# Patient Record
Sex: Female | Born: 1995
Health system: Southern US, Community
[De-identification: ages and names within clinical notes are randomized; demographics above are authoritative.]

## PROBLEM LIST (undated history)

## (undated) DIAGNOSIS — J45909 Unspecified asthma, uncomplicated: Secondary | ICD-10-CM

## (undated) DIAGNOSIS — F419 Anxiety disorder, unspecified: Secondary | ICD-10-CM

## (undated) DIAGNOSIS — T7840XA Allergy, unspecified, initial encounter: Secondary | ICD-10-CM

## (undated) HISTORY — PX: NO PAST SURGERIES: SHX2092

## (undated) HISTORY — DX: Allergy, unspecified, initial encounter: T78.40XA

---
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
## 2017-01-09 ENCOUNTER — Encounter (HOSPITAL_COMMUNITY): Payer: Self-pay | Admitting: Emergency Medicine

## 2017-01-09 ENCOUNTER — Ambulatory Visit (HOSPITAL_COMMUNITY)
Admission: EM | Admit: 2017-01-09 | Discharge: 2017-01-09 | Disposition: A | Discharge status: Home / Self Care | Payer: Self-pay

## 2017-01-09 DIAGNOSIS — M7652 Patellar tendinitis, left knee: Secondary | ICD-10-CM

## 2017-01-09 NOTE — Discharge Instructions (Signed)
Ice , advil and rest knee as discussed, no pe until mon.

## 2017-01-09 NOTE — ED Triage Notes (Signed)
[facility] a complaint of left knee pain x 3 days. The patient denied any known injury.

## 2017-01-09 NOTE — ED Provider Notes (Signed)
[facility]    CSN: [id number] Arrival date & time: 01/09/17  1739     History   Chief Complaint Chief Complaint  Patient presents with  . Knee Pain    HPI Mariah Haas is a [age] y.o. female.   The history is provided by the patient and a parent.  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 Asthma     Patient Active Problem List   Diagnosis Date Noted  . Migraine without aura and without status migrainosus, not intractable 01/28/2014    History reviewed. No pertinent surgical history.  OB History    No data available       Home Medications    Prior to Admission medications   Medication Sig Start Date End Date Taking? Authorizing Provider  SPRINTEC 28 0.25-35 MG-MCG tablet Take 1 tablet by mouth daily. 07/28/16  Yes Historical Provider, MD    Family History Family History  Problem Relation Age of Onset  . Seizures Mother   . Bipolar disorder Mother   . Anxiety disorder Mother   . Migraines Mother   . Lung cancer Maternal Grandmother   . Migraines Maternal Grandfather   . Bipolar disorder Maternal Grandfather   . Seizures Maternal Aunt     Febrile Szs as an infant  . Migraines Maternal Aunt   . Bipolar disorder Maternal Aunt   . Anxiety disorder Maternal Aunt     Social History Social History  Substance Use Topics  . Smoking status: Never Smoker  . Smokeless tobacco: Never Used  . Alcohol use No      Allergies   Other   Review of Systems Review of Systems  Constitutional: Negative.  Negative for fever.  Musculoskeletal: Positive for stiffness. Negative for back pain, gait problem and joint swelling.  Neurological: Negative.   All other systems reviewed and are negative.    Physical Exam Triage Vital Signs ED Triage Vitals [01/09/17 1812]  Enc Vitals Group     BP 127/70     Pulse Rate 69     Resp 16     Temp 98.8 F (37.1 C)     Temp Source Oral     SpO2 100 %     Weight      Height      Head Circumference      Peak Flow      Pain Score 5     Pain Loc      Pain Edu?      Excl. in GC?    No data found.   Updated Vital Signs BP 127/70 (BP Location: Right Arm)   Pulse 69   Temp 98.8 F (37.1 C) (Oral)   Resp 16   LMP 01/06/2017 (Exact Date)   SpO2 100%   Visual Acuity Right Eye Distance:   Left Eye Distance:  Bilateral Distance:    Right Eye Near:   Left Eye Near:    Bilateral Near:     Physical Exam  Constitutional: She is oriented to person, place, and time. She appears well-developed and well-nourished.  Musculoskeletal: She exhibits tenderness.       Left knee: She exhibits abnormal patellar mobility. She exhibits normal range of motion, no swelling, no effusion and no bony tenderness. Tenderness found. Patellar tendon tenderness noted. No medial joint line and no lateral joint line tenderness noted.  Neurological: She is alert and oriented to person, place, and time.  Skin: Skin is warm and dry.  Nursing note and vitals reviewed.    UC Treatments / Results  Labs (all labs ordered are listed, but only abnormal results are displayed) Labs Reviewed - No data to display  EKG  EKG Interpretation None       Radiology No results found.  Procedures Procedures (including critical care time)  Medications Ordered in UC Medications - No data to display   Initial Impression / Assessment and Plan / UC Course  I have reviewed the  triage vital signs and the nursing notes.  Pertinent labs & imaging results that were available during my care of the patient were reviewed by me and considered in my medical decision making (see chart for details).       Final Clinical Impressions(s) / UC Diagnoses   Final diagnoses:  Patellar tendonitis of left knee    New Prescriptions New Prescriptions   No medications on file     Mariah Hoff, MD 01/09/17 1911

## 2017-03-04 ENCOUNTER — Emergency Department (HOSPITAL_BASED_OUTPATIENT_CLINIC_OR_DEPARTMENT_OTHER)
Admission: EM | Admit: 2017-03-04 | Discharge: 2017-03-04 | Disposition: A | Discharge status: Home / Self Care | Payer: Self-pay | Attending: Emergency Medicine | Admitting: Emergency Medicine

## 2017-03-04 ENCOUNTER — Encounter (HOSPITAL_BASED_OUTPATIENT_CLINIC_OR_DEPARTMENT_OTHER): Payer: Self-pay

## 2017-03-04 DIAGNOSIS — J45909 Unspecified asthma, uncomplicated: Secondary | ICD-10-CM | POA: Insufficient documentation

## 2017-03-04 DIAGNOSIS — G44209 Tension-type headache, unspecified, not intractable: Secondary | ICD-10-CM | POA: Insufficient documentation

## 2017-03-04 MED ORDER — DEXAMETHASONE SODIUM PHOSPHATE 10 MG/ML IJ SOLN
10.0000 mg | Freq: Once | INTRAMUSCULAR | Status: AC
  Administered 2017-03-04: 10 mg via INTRAVENOUS
  Filled 2017-03-04: qty 1

## 2017-03-04 MED ORDER — DIPHENHYDRAMINE HCL 50 MG/ML IJ SOLN
25.0000 mg | Freq: Once | INTRAMUSCULAR | Status: AC
  Administered 2017-03-04: 25 mg via INTRAVENOUS
  Filled 2017-03-04: qty 1

## 2017-03-04 MED ORDER — KETOROLAC TROMETHAMINE 15 MG/ML IJ SOLN
15.0000 mg | Freq: Once | INTRAMUSCULAR | Status: AC
  Administered 2017-03-04: 15 mg via INTRAVENOUS
  Filled 2017-03-04: qty 1

## 2017-03-04 MED ORDER — SODIUM CHLORIDE 0.9 % IV BOLUS (SEPSIS)
1000.0000 mL | Freq: Once | INTRAVENOUS | Status: AC
  Administered 2017-03-04: 1000 mL via INTRAVENOUS

## 2017-03-04 MED ORDER — METOCLOPRAMIDE HCL 5 MG/ML IJ SOLN
10.0000 mg | Freq: Once | INTRAMUSCULAR | Status: AC
  Administered 2017-03-04: 10 mg via INTRAVENOUS
  Filled 2017-03-04: qty 2

## 2017-03-04 NOTE — ED Notes (Signed)
Pt sleeping; resp even/unlabored; family at bedside.

## 2017-03-04 NOTE — Discharge Instructions (Signed)
Take Ibuprofen as needed for headache Follow up with a doctor if symptoms are not getting better Return for worsening symptoms

## 2017-03-04 NOTE — ED Provider Notes (Signed)
[facility] Provider Note   CSN: [id number] Arrival date & time: 03/04/17  1647   By signing my name below, I, Talbert Nan, attest that this documentation has been prepared under the direction and in the presence of Terance Hart, PA-C. Electronically Signed: Talbert Nan, Scribe. 03/04/17. 6:53 PM.    History   Chief Complaint Chief Complaint  Patient presents with  . Headache    HPI Mariah Haas is a 21 y.o. female who presents to the Emergency Department complaining of gradual onset, moderate, diffuse, 7/10 severity headache that began 3 weeks ago. Pt has associated generalized weakness and photophobia. Pt has never had a headache like this before. Pt states that she has been taking ibuprofen with temporary relief. Pt states that she has never been diagnosed with migraines before but there is a family h/o migraines. The patient's uncle has h/o brain CA. Pt denies fever, head injury, increased stress, neck pain, numbness or tingling of face, nausea, vomiting, diarrhea, syncope, dizziness.   The history is provided by the patient and a parent. No language interpreter was used.    Past Medical History:  Diagnosis Date  . Asthma     Patient Active Problem List   Diagnosis Date Noted  . Migraine without aura and without status migrainosus, not intractable 01/28/2014    History reviewed. No pertinent surgical history.  OB History    No data available       Home Medications    Prior to Admission medications   Medication Sig Start Date End Date Taking? Authorizing Provider  SPRINTEC 28 0.25-35 MG-MCG tablet Take 1 tablet by mouth daily. 07/28/16   Historical Provider, MD    Family History Family History  Problem Relation Age of Onset  . Seizures Mother   . Bipolar disorder Mother   . Anxiety disorder Mother   . Migraines Mother   . Lung cancer Maternal Grandmother   . Migraines Maternal Grandfather   . Bipolar disorder Maternal Grandfather   .  Seizures Maternal Aunt     Febrile Szs as an infant  . Migraines Maternal Aunt   . Bipolar disorder Maternal Aunt   . Anxiety disorder Maternal Aunt     Social History Social History  Substance Use Topics  . Smoking status: Never Smoker  . Smokeless tobacco: Never Used  . Alcohol use No     Allergies   Patient has no active allergies.   Review of Systems Review of Systems  Constitutional: Negative for fever.  HENT: Negative for sinus pressure.   Eyes: Positive for photophobia. Negative for visual disturbance.  Gastrointestinal: Negative for diarrhea, nausea and vomiting.  Neurological: Positive for weakness and headaches. Negative for dizziness, syncope, light-headedness and numbness.     Physical Exam Updated Vital Signs BP 117/70 (BP Location: Right Arm)   Pulse 76   Temp 98.2 F (36.8 C) (Oral)   Resp 16   Ht 5' (1.524 m)   Wt 95 lb (43.1 kg)   LMP 02/27/2017   SpO2 100%   BMI 18.55 kg/m   Physical Exam  Constitutional: She is oriented to person, place, and time. She appears well-developed and well-nourished. No distress.  HENT:  Head: Normocephalic and atraumatic.  Eyes: Conjunctivae are normal. Pupils are equal, round, and reactive to light. Right eye exhibits no discharge. Left eye exhibits no discharge. No scleral icterus.  Neck: Normal range of motion. Neck supple.  Cardiovascular: Normal rate and regular rhythm.   No murmur heard.  Pulmonary/Chest: Effort normal and breath sounds normal. No respiratory distress.  Abdominal: Soft. She exhibits no distension. There is no tenderness.  Musculoskeletal: She exhibits no edema.  Mild tenderness of right trapezius  Neurological: She is alert and oriented to person, place, and time.  Lying on stretcher in NAD. GCS 15. Speaks in a clear voice. Cranial nerves II through XII grossly intact. 5/5 strength in all extremities. Sensation fully intact.  Bilateral finger-to-nose intact. Normal gait   Skin: Skin is warm  and dry.  Psychiatric: She has a normal mood and affect. Her behavior is normal.  Nursing note and vitals reviewed.    ED Treatments / Results   DIAGNOSTIC STUDIES: Oxygen Saturation is 100% on room air, normal by my interpretation.    COORDINATION OF CARE: 6:49 PM Discussed treatment plan with pt at bedside and pt agreed to plan, which include IV migraine cocktail and fluids.    Labs (all labs ordered are listed, but only abnormal results are displayed) Labs Reviewed - No data to display  EKG  EKG Interpretation None       Radiology No results found.  Procedures Procedures (including critical care time)  Medications Ordered in ED Medications - No data to display   Initial Impression / Assessment and Plan / ED Course   I have reviewed the triage vital signs and the nursing notes.  Pertinent labs & imaging results that were available during my care of the patient were reviewed by me and considered in my medical decision making (see chart for details).  21 year old female with headache. Vitals are normal. Pt HA treated and improved while in ED.  Although she denies headache like this before, she is low risk for [facility], ICH, Meningitis, or temporal arteritis. Pt is afebrile with no focal neuro deficits, nuchal rigidity, or change in vision. Pt is to follow up with PCP if headache returns and persists. Return precautions to ED given. Pt verbalizes understanding and is agreeable with plan to dc.    Final Clinical Impressions(s) / ED Diagnoses   Final diagnoses:  Acute non intractable tension-type headache    New Prescriptions New Prescriptions   No medications on file   I personally performed the services described in this documentation, which was scribed in my presence. The recorded information has been reviewed and is accurate.     Bethel Born, PA-C 03/05/17 1543    Geoffery Lyons, MD 03/05/17 2132

## 2017-03-04 NOTE — ED Triage Notes (Signed)
c/o HA x 3 weeks-NAD-steady gait

## 2017-03-15 ENCOUNTER — Emergency Department (HOSPITAL_COMMUNITY)
Admission: EM | Admit: 2017-03-15 | Discharge: 2017-03-15 | Disposition: A | Discharge status: Home / Self Care | Payer: Self-pay | Attending: Emergency Medicine | Admitting: Emergency Medicine

## 2017-03-15 ENCOUNTER — Encounter (HOSPITAL_COMMUNITY): Payer: Self-pay | Admitting: Emergency Medicine

## 2017-03-15 DIAGNOSIS — G44209 Tension-type headache, unspecified, not intractable: Secondary | ICD-10-CM | POA: Insufficient documentation

## 2017-03-15 DIAGNOSIS — Z79899 Other long term (current) drug therapy: Secondary | ICD-10-CM | POA: Insufficient documentation

## 2017-03-15 DIAGNOSIS — R55 Syncope and collapse: Secondary | ICD-10-CM | POA: Insufficient documentation

## 2017-03-15 DIAGNOSIS — J45909 Unspecified asthma, uncomplicated: Secondary | ICD-10-CM | POA: Insufficient documentation

## 2017-03-15 LAB — URINALYSIS, ROUTINE W REFLEX MICROSCOPIC
BACTERIA UA: NONE SEEN
BILIRUBIN URINE: NEGATIVE
Glucose, UA: NEGATIVE mg/dL
Ketones, ur: NEGATIVE mg/dL
LEUKOCYTES UA: NEGATIVE
Nitrite: NEGATIVE
Protein, ur: NEGATIVE mg/dL
SPECIFIC GRAVITY, URINE: 1.021 (ref 1.005–1.030)
pH: 5 (ref 5.0–8.0)

## 2017-03-15 LAB — CBC
HEMATOCRIT: 40 % (ref 36.0–46.0)
Hemoglobin: 13.2 g/dL (ref 12.0–15.0)
MCH: 28.9 pg (ref 26.0–34.0)
MCHC: 33 g/dL (ref 30.0–36.0)
MCV: 87.5 fL (ref 78.0–100.0)
Platelets: 318 10*3/uL (ref 150–400)
RBC: 4.57 MIL/uL (ref 3.87–5.11)
RDW: 12.6 % (ref 11.5–15.5)
WBC: 8.3 10*3/uL (ref 4.0–10.5)

## 2017-03-15 LAB — BASIC METABOLIC PANEL
Anion gap: 7 (ref 5–15)
BUN: 11 mg/dL (ref 6–20)
CHLORIDE: 104 mmol/L (ref 101–111)
CO2: 25 mmol/L (ref 22–32)
Calcium: 9.3 mg/dL (ref 8.9–10.3)
Creatinine, Ser: 0.73 mg/dL (ref 0.44–1.00)
GFR calc Af Amer: >60 mL/min (ref >60)
GFR calc non Af Amer: >60 mL/min (ref >60)
GLUCOSE: 98 mg/dL (ref 65–99)
POTASSIUM: 4.1 mmol/L (ref 3.5–5.1)
SODIUM: 136 mmol/L (ref 135–145)

## 2017-03-15 LAB — I-STAT BETA HCG BLOOD, ED (MC, WL, AP ONLY)

## 2017-03-15 LAB — CBG MONITORING, ED: GLUCOSE-CAPILLARY: 106 mg/dL — AB (ref 65–99)

## 2017-03-15 MED ORDER — KETOROLAC TROMETHAMINE 30 MG/ML IJ SOLN
30.0000 mg | Freq: Once | INTRAMUSCULAR | Status: AC
  Administered 2017-03-15: 30 mg via INTRAVENOUS
  Filled 2017-03-15: qty 1

## 2017-03-15 MED ORDER — SODIUM CHLORIDE 0.9 % IV BOLUS (SEPSIS)
1000.0000 mL | Freq: Once | INTRAVENOUS | Status: AC
  Administered 2017-03-15: 1000 mL via INTRAVENOUS

## 2017-03-15 NOTE — ED Triage Notes (Signed)
Pt verbalizes awoke around 0800 with shakiness and "felling like passing out." Pt verbalizes has eaten today; pt denies n/v/d.

## 2017-03-15 NOTE — ED Notes (Signed)
ED Provider at bedside. 

## 2017-03-15 NOTE — ED Notes (Signed)
Pt ambulatory and independent at discharge.  Verbalized understanding of discharge instructions 

## 2017-03-15 NOTE — ED Provider Notes (Addendum)
[facility]-EMERGENCY DEPT Provider Note   CSN: [id number] Arrival date & time: 03/15/17  1608     History   Chief Complaint Chief Complaint  Patient presents with  . Near Syncope    HPI Mariah Haas is a 21 y.o. female.  Patient is a 21 year old female with a history of asthma and migraines who presents with a near syncopal episode. She states this morning she walked into the kitchen and got dizzy lightheaded and felt like she was on a pass out. She doesn't feel like that now but she just feels weak all over. She has a bifrontal-type headache which she says she's been having pre-much daily for about the last 2-3 months. She denies any numbness or weakness to her extreme age. No vision changes. No recent cough or cold symptoms. No chest pain or shortness of breath. No palpitations. No urinary symptoms. Her last menstrual cycle was on April 11 and was normal for her. She's been eating and drinking okay.      Past Medical History:  Diagnosis Date  . Asthma     Patient Active Problem List   Diagnosis Date Noted  . Migraine without aura and without status migrainosus, not intractable 01/28/2014    History reviewed. No pertinent surgical history.  OB History    No data available       Home Medications    Prior to Admission medications   Medication Sig Start Date End Date Taking? Authorizing Provider  SPRINTEC 28 0.25-35 MG-MCG tablet Take 1 tablet by mouth daily. 07/28/16  Yes Historical Provider, MD    Family History Family History  Problem Relation Age of Onset  . Seizures Mother   . Bipolar disorder Mother   . Anxiety disorder Mother   . Migraines Mother   . Lung cancer Maternal Grandmother   . Migraines Maternal Grandfather   . Bipolar disorder Maternal Grandfather   . Seizures Maternal Aunt     Febrile Szs as an infant  . Migraines Maternal Aunt   . Bipolar disorder Maternal Aunt   . Anxiety disorder Maternal Aunt     Social History Social History    Substance Use Topics  . Smoking status: Never Smoker  . Smokeless tobacco: Never Used  . Alcohol use No     Allergies   Patient has no known allergies.   Review of Systems Review of Systems  Constitutional: Positive for fatigue. Negative for chills, diaphoresis and fever.  HENT: Negative for congestion, rhinorrhea and sneezing.   Eyes: Negative.   Respiratory: Negative for cough, chest tightness and shortness of breath.   Cardiovascular: Negative for chest pain and leg swelling.  Gastrointestinal: Negative for abdominal pain, blood in stool, diarrhea, nausea and vomiting.  Genitourinary: Negative for difficulty urinating, flank pain, frequency and hematuria.  Musculoskeletal: Negative for arthralgias and back pain.  Skin: Negative for rash.  Neurological: Positive for weakness (Generalized), light-headedness and headaches. Negative for dizziness, speech difficulty and numbness.     Physical Exam Updated Vital Signs BP 106/64 (BP Location: Right Arm)   Pulse 78   Temp 98.8 F (37.1 C) (Oral)   Resp (!) 21   LMP 02/27/2017   SpO2 100%   Physical Exam  Constitutional: She is oriented to person, place, and time. She appears well-developed and well-nourished.  HENT:  Head: Normocephalic and atraumatic.  Eyes: Pupils are equal, round, and reactive to light.  Neck: Normal range of motion. Neck supple.  Cardiovascular: Normal rate, regular rhythm and normal  heart sounds.   Pulmonary/Chest: Effort normal and breath sounds normal. No respiratory distress. She has no wheezes. She has no rales. She exhibits no tenderness.  Abdominal: Soft. Bowel sounds are normal. There is no tenderness. There is no rebound and no guarding.  Musculoskeletal: Normal range of motion. She exhibits no edema.  Lymphadenopathy:    She has no cervical adenopathy.  Neurological: She is alert and oriented to person, place, and time.  Motor 5/5 all extremities Sensation grossly intact to LT all  extremities Finger to Nose intact, no pronator drift CN II-XII grossly intact Gait normal   Skin: Skin is warm and dry. No rash noted.  Psychiatric: She has a normal mood and affect.     ED Treatments / Results  Labs (all labs ordered are listed, but only abnormal results are displayed) Labs Reviewed  URINALYSIS, ROUTINE W REFLEX MICROSCOPIC - Abnormal; Notable for the following:       Result Value   APPearance HAZY (*)    Hgb urine dipstick SMALL (*)    Squamous Epithelial / LPF 0-5 (*)    All other components within normal limits  CBG MONITORING, ED - Abnormal; Notable for the following:    Glucose-Capillary 106 (*)    All other components within normal limits  BASIC METABOLIC PANEL  CBC  I-STAT BETA HCG BLOOD, ED ([facility], [facility], [facility] ONLY)    EKG  EKG Interpretation  Date/Time:  Friday March 15 2017 16:16:03 EDT Ventricular Rate:  75 PR Interval:    QRS Duration: 84 QT Interval:  367 QTC Calculation: 410 R Axis:   79 Text Interpretation:  Sinus rhythm Low voltage, precordial leads Nonspecific T abnormalities, anterior leads Baseline wander in lead(s) I aVR V6 since last tracing no significant change Confirmed by Chessica Audia  MD, Teanna Elem ([id number]) on 03/15/2017 4:34:36 PM       Radiology No results found.  Procedures Procedures (including critical care time)  Medications Ordered in ED Medications  sodium chloride 0.9 % bolus 1,000 mL (0 mLs Intravenous Stopped 03/15/17 1837)  ketorolac (TORADOL) 30 MG/ML injection 30 mg (30 mg Intravenous Given 03/15/17 1700)     Initial Impression / Assessment and Plan / ED Course  I have reviewed the triage vital signs and the nursing notes.  Pertinent labs & imaging results that were available during my care of the patient were reviewed by me and considered in my medical decision making (see chart for details).     Patient presents after a near-syncopal episode happened this morning. Her labs are non-concerning. Her pregnancy test is  negative. Her urine does not appear to be infected. She's neurologically intact. She does complain of a headache. She's had this similar type headaches over the last 2-3 months. She has no focal neurologic deficits. She is ambulating without ataxia. Her headache has resolved after Toradol. She was also given IV fluids. She was discharged home good condition.  She has an appointment to follow-up with her primary care physician on Monday. Return precautions were given.  Final Clinical Impressions(s) / ED Diagnoses   Final diagnoses:  Near syncope  Tension-type headache, not intractable, unspecified chronicity pattern    New Prescriptions Discharge Medication List as of 03/15/2017  6:37 PM       Rolan Bucco, MD 03/15/17 1610    Rolan Bucco, MD 03/15/17 [id number]

## 2017-03-18 ENCOUNTER — Encounter: Payer: Self-pay | Admitting: Family Medicine

## 2017-03-18 ENCOUNTER — Ambulatory Visit (INDEPENDENT_AMBULATORY_CARE_PROVIDER_SITE_OTHER): Payer: Self-pay | Admitting: Family Medicine

## 2017-03-18 VITALS — BP 98/60 | HR 68 | Resp 12 | Ht 60.0 in | Wt 94.1 lb

## 2017-03-18 DIAGNOSIS — R51 Headache: Secondary | ICD-10-CM

## 2017-03-18 DIAGNOSIS — J309 Allergic rhinitis, unspecified: Secondary | ICD-10-CM | POA: Insufficient documentation

## 2017-03-18 DIAGNOSIS — R519 Headache, unspecified: Secondary | ICD-10-CM

## 2017-03-18 MED ORDER — AMITRIPTYLINE HCL 25 MG PO TABS: 12.0000 mg | ORAL_TABLET | Freq: Every day | ORAL | 1 refills | Status: DC

## 2017-03-18 MED ORDER — FLUTICASONE PROPIONATE 50 MCG/ACT NA SUSP: 1.0000 | Freq: Two times a day (BID) | NASAL | 3 refills | Status: DC

## 2017-03-18 NOTE — Patient Instructions (Signed)
A few things to remember from today's visit:   Worsening headaches - Plan: MR MRA Head/Brain Wo Cm, amitriptyline (ELAVIL) 25 MG tablet  Allergic rhinitis, unspecified seasonality, unspecified trigger - Plan: fluticasone (FLONASE) 50 MCG/ACT nasal spray  Eye exam.  Medication causes drowsiness, so take at bedtime.   Please be sure medication list is accurate. If a new problem present, please set up appointment sooner than planned today.

## 2017-03-18 NOTE — Progress Notes (Signed)
Pre visit review using our clinic review tool, if applicable. No additional management support is needed unless otherwise documented below in the visit note. 

## 2017-03-18 NOTE — Progress Notes (Signed)
HPI:   [name] is a 21 y.o. female, who is here today with her mother to establish care.  Former PCP: Dr Earlene Plater Last preventive routine visit: 1-2 years.  Chronic medical problems: Asthma, allergic rhinitis. Vaccination reported as up to date.    Concerns today: Headaches.  Her mother is very concerned about constant headaches for the past 2 months. She has ben in the ER 4 times since 11/2016: sore throat,knee pain, and last 2 visits in 02/2017 for headache and syncopal episode.  Started with new onset headache last week of March 2018, affects different areas and sometimes the whole head. She has daily headaches. She is not sure about exacerbating or alleviating factors. She wakes up with headache, sharp, 8-9/10. OTC Advil helps little. Headache getting worse.  Denies cervical pain. No associated photophobia,visual changes,nausea,vomiting,or focal weakness.  Denies fever,chills,abnormal wt loss, decreased appetite,or sleep disturbances.  2-3 months ago she felt backwards, landed on her back,denies head trauma.  03/15/17 she was in the ER because near syncope. Episode was not witnessed , denies seizure like activity,urine/bowel incontinence. She felt like she was going to [employer] lie on floor for a few minutes.  Lab Results  Component Value Date   WBC 8.3 03/15/2017   HGB 13.2 03/15/2017   HCT 40.0 03/15/2017   MCV 87.5 03/15/2017   PLT 318 03/15/2017   Lab Results  Component Value Date   CREATININE 0.73 03/15/2017   BUN 11 03/15/2017   NA 136 03/15/2017   K 4.1 03/15/2017   CL 104 03/15/2017   CO2 25 03/15/2017    LMP 3 weeks ago. She denies any stressful situation at home or school. She is in 12 grade, making A's and B's.  She lives with parents and brother. Denies Hx of anxiety or depression.  Mother is upset because brain MRI was not done. Mother mentions that maternal uncle was Dx with astrocytoma.  She denies prior Hx of headaches  but has migraine listed on her problem list. Mother has Hx of migraines.  Hx of allergies, having nasal congestion,rhinorrhea, and post nasal drainage. She is not taking OTC medications.  Last eye exam 2 years ago. She has taken OCP's for a few years.   Review of Systems  Constitutional: Positive for fatigue. Negative for chills, fever and unexpected weight change.  HENT: Positive for congestion, postnasal drip and rhinorrhea. Negative for mouth sores, nosebleeds, sinus pain, sore throat and trouble swallowing.   Eyes: Negative for photophobia, pain and visual disturbance.  Respiratory: Negative for shortness of breath and wheezing.   Cardiovascular: Negative for chest pain, palpitations and leg swelling.  Gastrointestinal: Negative for abdominal pain, nausea and vomiting.       No changes in bowel habits.  Endocrine: Negative for cold intolerance and heat intolerance.  Genitourinary: Negative for decreased urine volume and hematuria.  Musculoskeletal: Negative for gait problem and myalgias.  Skin: Negative for pallor and rash.  Allergic/Immunologic: Positive for environmental allergies.  Neurological: Positive for headaches. Negative for seizures, syncope, weakness and numbness.  Hematological: Negative for adenopathy. Does not bruise/bleed easily.  Psychiatric/Behavioral: Negative for confusion and sleep disturbance. The patient is not nervous/anxious.       Current Outpatient Prescriptions on File Prior to Visit  Medication Sig Dispense Refill  . SPRINTEC 28 0.25-35 MG-MCG tablet Take 1 tablet by mouth daily.  2   No current facility-administered medications on file prior to visit.      Past  Medical History:  Diagnosis Date  . Allergy   . Asthma    No Known Allergies  Family History  Problem Relation Age of Onset  . Seizures Mother   . Bipolar disorder Mother   . Anxiety disorder Mother   . Migraines Mother   . Headache Mother   . Diabetes Mother   . Lung  cancer Maternal Grandmother   . Migraines Maternal Grandfather   . Bipolar disorder Maternal Grandfather   . Seizures Maternal Aunt     Febrile Szs as an infant  . Migraines Maternal Aunt   . Bipolar disorder Maternal Aunt   . Anxiety disorder Maternal Aunt   . Cancer Maternal Aunt     lung  . Cancer Paternal Uncle     Astrocytoma    Social History   Social History  . Marital status: Single    Spouse name: N/A  . Number of children: N/A  . Years of education: N/A   Social History Main Topics  . Smoking status: Never Smoker  . Smokeless tobacco: Never Used  . Alcohol use No  . Drug use: No  . Sexual activity: Not Asked   Other Topics Concern  . None   Social History Narrative  . None    Vitals:   03/18/17 0850  BP: 98/60  Pulse: 68  Resp: 12    Body mass index is 18.38 kg/m.  Physical Exam  Nursing note and vitals reviewed. Constitutional: She is oriented to person, place, and time. She appears well-developed and well-nourished. No distress.  HENT:  Head: Atraumatic.  Nose: Right sinus exhibits no maxillary sinus tenderness and no frontal sinus tenderness. Left sinus exhibits no maxillary sinus tenderness and no frontal sinus tenderness.  Mouth/Throat: Uvula is midline, oropharynx is clear and moist and mucous membranes are normal.  Hypertrophic turbinates,post nasal drainage.   Eyes: Conjunctivae and EOM are normal. Pupils are equal, round, and reactive to light.  Neck: No tracheal deviation present. No thyroid mass and no thyromegaly present.  Cardiovascular: Normal rate and regular rhythm.   No murmur heard. Pulses:      Dorsalis pedis pulses are 2+ on the right side, and 2+ on the left side.  Respiratory: Effort normal and breath sounds normal. No respiratory distress.  GI: Soft. She exhibits no mass. There is no hepatomegaly. There is no tenderness.  Musculoskeletal: She exhibits no edema.       Cervical back: She exhibits normal range of motion, no  tenderness and no bony tenderness.       Thoracic back: She exhibits no tenderness and no bony tenderness.  Tenderness upon palpation of parietal scalp.  Lymphadenopathy:    She has no cervical adenopathy.  Neurological: She is alert and oriented to person, place, and time. She has normal strength. No cranial nerve deficit. Coordination and gait normal.  Reflex Scores:      Bicep reflexes are 2+ on the right side and 2+ on the left side.      Patellar reflexes are 2+ on the right side and 2+ on the left side. Skin: Skin is warm. No erythema.  Psychiatric:  Well groomed, poor eye contact. Flat mood.    ASSESSMENT AND PLAN:   Dorrine was seen today for establish care.  Diagnoses and all orders for this visit:  Worsening headaches  We discussed possible etiologies, neurologic examination today normal. ? Tension headache. ? Migraine.  Brain MRI was ordered. Recommend scheduling eye exam.  Clearly instructed about  warning signs. She agrees with trying low dose Amitriptyline, we discussed some side effects. F/U in 3-4 weeks.  -     MR MRA Head/Brain Wo Cm; Future -     amitriptyline (ELAVIL) 25 MG tablet; Take 0.5 tablets (12.5 mg total) by mouth at bedtime.  Allergic rhinitis, unspecified seasonality, unspecified trigger  Could aggravate headaches. Nasal irrigations as needed. Flonase nasal spray and OTC antihistaminic recommended.  -     fluticasone (FLONASE) 50 MCG/ACT nasal spray; Place 1 spray into both nostrils 2 (two) times daily.        Mariah Stickle G. [place], MD  [facility]. [place] office.

## 2017-03-28 ENCOUNTER — Other Ambulatory Visit: Payer: Self-pay | Admitting: Family Medicine

## 2017-03-28 ENCOUNTER — Ambulatory Visit
Admission: RE | Admit: 2017-03-28 | Discharge: 2017-03-28 | Disposition: A | Discharge status: Home / Self Care | Payer: No Typology Code available for payment source | Source: Ambulatory Visit | Attending: Family Medicine | Admitting: Family Medicine

## 2017-03-28 DIAGNOSIS — R519 Headache, unspecified: Secondary | ICD-10-CM

## 2017-03-28 DIAGNOSIS — R51 Headache: Principal | ICD-10-CM

## 2017-04-08 NOTE — Progress Notes (Signed)
HPI:   Ms.Tesia MOE BRIER is a 21 y.o. female, who is here today with her mother to follow on recent OV.   She was seen on 03/18/17, at that time she and her mother were concerned about 2 months of constant headaches. She started Amitriptyline 25 mg , taking 1/2 tab at bedtime, she is tolerating medication well,deneis side effects. Headache is mainly occipital and parietal, sometimes frontal, dull, now 2/10 (8-9/10), one time it was 8/10. She is still having daily headaches but she does not wake up with it, usually starts around 11-12 noon. She has not missed school or been in the ER since her last OV. She denies associated visual changes, photophobia, nausea,or vomiting.   03/28/17 Brain MRI: Normal MRI of the brain Mild mucosal edema in the paranasal sinuses.  She is using Flonase nasal spray and nasal congestion, rhinorrhea have improved. She is using it as needed. She has not had eye exam.  She denies fever,chills, fatigue, or body aches.   Overall she is feeling better, no new symptoms reported.   No new concerns today.   Review of Systems  Constitutional: Negative for chills, fatigue, fever and unexpected weight change.  HENT: Positive for sinus pressure. Negative for dental problem, facial swelling, nosebleeds, tinnitus and trouble swallowing.   Eyes: Negative for photophobia, pain and visual disturbance.  Respiratory: Negative for cough, shortness of breath and wheezing.   Cardiovascular: Negative for palpitations and leg swelling.  Gastrointestinal: Negative for abdominal pain, nausea and vomiting.       No changes in bowel habits.  Musculoskeletal: Negative for back pain and neck pain.  Skin: Negative for rash.  Allergic/Immunologic: Positive for environmental allergies.  Neurological: Positive for headaches. Negative for seizures, syncope, weakness and numbness.  Psychiatric/Behavioral: Negative for confusion and sleep disturbance. The patient is not  nervous/anxious.       Current Outpatient Prescriptions on File Prior to Visit  Medication Sig Dispense Refill  . fluticasone (FLONASE) 50 MCG/ACT nasal spray Place 1 spray into both nostrils 2 (two) times daily. 16 g 3  . SPRINTEC 28 0.25-35 MG-MCG tablet Take 1 tablet by mouth daily.  2   No current facility-administered medications on file prior to visit.      Past Medical History:  Diagnosis Date  . Allergy   . Asthma    No Known Allergies  Social History   Social History  . Marital status: Single    Spouse name: N/A  . Number of children: N/A  . Years of education: N/A   Social History Main Topics  . Smoking status: Never Smoker  . Smokeless tobacco: Never Used  . Alcohol use No  . Drug use: No  . Sexual activity: Not Asked   Other Topics Concern  . None   Social History Narrative  . None    Vitals:   04/09/17 0929  BP: 94/70  Pulse: 90  Resp: 12   Body mass index is 18.94 kg/m.   Physical Exam  Nursing note and vitals reviewed. Constitutional: She is oriented to person, place, and time. She appears well-developed and well-nourished. No distress.  HENT:  Head: Atraumatic.  Nose: Right sinus exhibits frontal sinus tenderness. Right sinus exhibits no maxillary sinus tenderness. Left sinus exhibits frontal sinus tenderness. Left sinus exhibits no maxillary sinus tenderness.  Mouth/Throat: Oropharynx is clear and moist and mucous membranes are normal.  Eyes: Conjunctivae and EOM are normal. Pupils are equal, round, and reactive to  light.  Cardiovascular: Normal rate and regular rhythm.   No murmur heard. Pulses:      Dorsalis pedis pulses are 2+ on the right side, and 2+ on the left side.  Respiratory: Effort normal and breath sounds normal. No respiratory distress.  Musculoskeletal: She exhibits no edema.       Cervical back: She exhibits normal range of motion, no tenderness and no bony tenderness.  Lymphadenopathy:    She has no cervical  adenopathy.  Neurological: She is alert and oriented to person, place, and time. She has normal strength. No cranial nerve deficit. Gait normal.  Reflex Scores:      Patellar reflexes are 2+ on the right side and 2+ on the left side. Skin: Skin is warm. No rash noted. No erythema.  Psychiatric: She has a normal mood and affect.  Well groomed, good eye contact.     ASSESSMENT AND PLAN:    Rexene AlbertsBridgett was seen today for follow-up.  Diagnoses and all orders for this visit:  Tension headache  Improved. Amitriptyline increased from 12.5 mg to 25 mg at bedtime.Some side effects discussed. Instructed about warning signs. Eye still exam recommended.  F/U in 3 months.  -     amitriptyline (ELAVIL) 25 MG tablet; Take 1 tablet (25 mg total) by mouth at bedtime.  Allergic rhinitis, unspecified seasonality, unspecified trigger  Could also be contributing to headaches. She agrees with trying Prednisone for 3 days, some side effects discussed. Continue Flonase nasal spray as needed and try OTC antihistaminic.  -     predniSONE (DELTASONE) 20 MG tablet; Take 2 tablets (40 mg total) by mouth daily with breakfast.  Sinus pain  Brain MRI reviewed. Because on exam tender frontal sinuses + some mild paranasal mucosa edema I recommended course of abx.  -     amoxicillin-clavulanate (AUGMENTIN) 875-125 MG tablet; Take 1 tablet by mouth 2 (two) times daily.        Juelz Whittenberg G. [place], MD  [facility]. [place] office.

## 2017-04-09 ENCOUNTER — Encounter: Payer: Self-pay | Admitting: Family Medicine

## 2017-04-09 ENCOUNTER — Ambulatory Visit (INDEPENDENT_AMBULATORY_CARE_PROVIDER_SITE_OTHER): Payer: Self-pay | Admitting: Family Medicine

## 2017-04-09 VITALS — BP 94/70 | HR 90 | Resp 12 | Ht 60.0 in | Wt 97.0 lb

## 2017-04-09 DIAGNOSIS — J3489 Other specified disorders of nose and nasal sinuses: Secondary | ICD-10-CM

## 2017-04-09 DIAGNOSIS — G44209 Tension-type headache, unspecified, not intractable: Secondary | ICD-10-CM

## 2017-04-09 DIAGNOSIS — J309 Allergic rhinitis, unspecified: Secondary | ICD-10-CM

## 2017-04-09 MED ORDER — PREDNISONE 20 MG PO TABS: 40.0000 mg | ORAL_TABLET | Freq: Every day | ORAL | 0 refills | Status: AC

## 2017-04-09 MED ORDER — AMITRIPTYLINE HCL 25 MG PO TABS: 25.0000 mg | ORAL_TABLET | Freq: Every day | ORAL | 1 refills | Status: DC

## 2017-04-09 MED ORDER — AMOXICILLIN-POT CLAVULANATE 875-125 MG PO TABS: 1.0000 | ORAL_TABLET | Freq: Two times a day (BID) | ORAL | 0 refills | Status: AC

## 2017-04-09 NOTE — Patient Instructions (Addendum)
A few things to remember from today's visit:   Allergic rhinitis, unspecified seasonality, unspecified trigger - Plan: predniSONE (DELTASONE) 20 MG tablet  Sinus pain - Plan: amoxicillin-clavulanate (AUGMENTIN) 875-125 MG tablet  Tension headache - Plan: amitriptyline (ELAVIL) 25 MG tablet  Eye exam pending.  Allegra 180 or Zyrtec to consider. Amitriptyline increased to 25 mg.  Take Antibiotic and Prednisone with food.  There are 2 forms of allergic rhinitis: . Seasonal (hay fever): Caused by an allergy to pollen and/or mold spores in the air. Pollen is the fine powder that comes from the stamen of flowering plants. It can be carried through the air and is easily inhaled. Symptoms are seasonal and usually occur in spring, late summer, and fall. Marland Kitchen. Perennial: Caused by other allergens such as dust mites, pet hair or dander, or mold. Symptoms occur year-round.  Symptoms: Your symptoms can vary, depending on the severity of your allergies. Symptoms can include: Sneezing, coughing.itching (mostly eyes, nose, mouth, throat and skin),runny nose,stuffy nose.headache,pressure in the nose and cheeks,ear fullness and popping, sore throat.watery, red, or swollen eyes,dark circles under your eyes,trouble smelling, and sometimes hives.  Allergic rhinitis cannot be prevented. You can help your symptoms by avoiding the things that you are allergic, including: . Keeping windows closed. This is especially important during high-pollen seasons. . Washing your hands after petting animals. . Using dust- and mite-proof bedding and mattress covers. . Wearing glasses outside to protect your eyes. . Showering before bed to wash off allergens from hair and skin. You can also avoid things that can make your symptoms worse, such as: . aerosol sprays . air pollution . cold temperatures . humidity . irritating fumes . tobacco smoke . wind . wood smoke.   Antihistamines help reduce the sneezing, runny nose,  and itchiness of allergies. These come in pill form and as nasal sprays. Allegra,Zyrtec,or Claritin are some examples. Decongestants, such as pseudoephedrine and phenylephrine, help temporarily relieve the stuffy nose of allergies. Decongestants are found in many medicines and come as pills, nose sprays, and nose drops. They could increase heart rate and cause tachycardia and tremor. Nasal Afrin should not be used for more than 3 days because you can become dependent on them. This causes you to feel even more stopped-up when you try to quit using them.  Nasal sprays: steroids or antihistaminics. Over the counter intranasal sterids: Nasocort,Rhinocort,or Flonase.You won't notice their benefits for up to 2 weeks after starting them. Allergy shots or sublingual tablets when other treatment do not help.This is done by immunologists.    Please be sure medication list is accurate. If a new problem present, please set up appointment sooner than planned today.

## 2017-06-09 ENCOUNTER — Encounter (HOSPITAL_COMMUNITY): Payer: Self-pay | Admitting: *Deleted

## 2017-06-09 ENCOUNTER — Ambulatory Visit (HOSPITAL_COMMUNITY)
Admission: EM | Admit: 2017-06-09 | Discharge: 2017-06-09 | Disposition: A | Discharge status: Home / Self Care | Payer: No Typology Code available for payment source | Attending: Physician Assistant | Admitting: Physician Assistant

## 2017-06-09 DIAGNOSIS — R1084 Generalized abdominal pain: Secondary | ICD-10-CM | POA: Diagnosis not present

## 2017-06-09 LAB — POCT URINALYSIS DIP (DEVICE)
Bilirubin Urine: NEGATIVE
Glucose, UA: NEGATIVE mg/dL
Hgb urine dipstick: NEGATIVE
KETONES UR: NEGATIVE mg/dL
Leukocytes, UA: NEGATIVE
Nitrite: NEGATIVE
PH: 7.5 (ref 5.0–8.0)
PROTEIN: 30 mg/dL — AB
Specific Gravity, Urine: 1.02 (ref 1.005–1.030)
Urobilinogen, UA: 0.2 mg/dL (ref 0.0–1.0)

## 2017-06-09 LAB — POCT PREGNANCY, URINE: Preg Test, Ur: NEGATIVE

## 2017-06-09 MED ORDER — ONDANSETRON HCL 4 MG PO TABS: 4.0000 mg | ORAL_TABLET | Freq: Three times a day (TID) | ORAL | Status: DC | PRN

## 2017-06-09 MED ORDER — NAPROXEN 500 MG PO TABS: 500.0000 mg | ORAL_TABLET | Freq: Two times a day (BID) | ORAL | 0 refills | Status: DC

## 2017-06-09 MED ORDER — ONDANSETRON HCL 4 MG PO TABS: 4.0000 mg | ORAL_TABLET | Freq: Four times a day (QID) | ORAL | 0 refills | Status: DC

## 2017-06-09 MED ORDER — DICYCLOMINE HCL 20 MG PO TABS: 10.0000 mg | ORAL_TABLET | Freq: Three times a day (TID) | ORAL | 0 refills | Status: DC

## 2017-06-09 NOTE — Discharge Instructions (Signed)
Take medication as prescribed.  If you pain persist for more than 3-4 days and does not seem to be improving please go to your primary care doctor or go to the ED.  If you develop fever please go to the ED sooner.

## 2017-06-09 NOTE — ED Provider Notes (Addendum)
06/09/2017 5:08 PM   DOB: 03/06/1996 / MRN: [id number]  SUBJECTIVE:  Mariah Haas is a 21 y.o. female presenting for abdominal pain. Tells me this has been preceded by sore throat and HA.  She feels that she is getting worse. Did have some non bloody diarrhea yesterday.  Denies GERD.  She is no worse or better.  She has not tried anything for the pain.  Takes OCP and is not missing doses. She did miss work today due to this problem.   She has No Known Allergies.   She  has a past medical history of Allergy and Asthma.    She  reports that she has never smoked. She has never used smokeless tobacco. She reports that she does not drink alcohol or use drugs. She  has no sexual activity history on file. The patient  has no past surgical history on file.  Her family history includes Anxiety disorder in her maternal aunt and mother; Bipolar disorder in her maternal aunt, maternal grandfather, and mother; Cancer in her maternal aunt and paternal uncle; Diabetes in her mother; Headache in her mother; Lung cancer in her maternal grandmother; Migraines in her maternal aunt, maternal grandfather, and mother; Seizures in her maternal aunt and mother.  Review of Systems  Constitutional: Negative for chills and fever.  Respiratory: Positive for cough. Negative for hemoptysis, shortness of breath and wheezing.   Cardiovascular: Negative for chest pain.  Gastrointestinal: Negative for abdominal pain and nausea.  Genitourinary: Positive for urgency. Negative for dysuria, flank pain, frequency and hematuria.  Skin: Negative for itching and rash.  Neurological: Negative for dizziness.    OBJECTIVE:  BP 118/61 (BP Location: Right Arm)   Pulse 63   Temp 98.9 F (37.2 C) (Oral)   Resp 18   LMP 05/19/2017   SpO2 100%   Physical Exam  Constitutional: She is oriented to person, place, and time. She appears well-developed and well-nourished. She is active. No distress.  Cardiovascular: Normal rate.    Pulmonary/Chest: Effort normal and breath sounds normal. No tachypnea.  Abdominal: Bowel sounds are normal. She exhibits no distension and no mass. There is no tenderness. There is no rebound and no guarding.  Musculoskeletal: Normal range of motion.  Neurological: She is alert and oriented to person, place, and time.  Skin: Skin is warm. She is not diaphoretic.    Results for orders placed or performed during the hospital encounter of 06/09/17 (from the past 72 hour(s))  POCT urinalysis dip (device)     Status: Abnormal   Collection Time: 06/09/17  4:26 PM  Result Value Ref Range   Glucose, UA NEGATIVE NEGATIVE mg/dL   Bilirubin Urine NEGATIVE NEGATIVE   Ketones, ur NEGATIVE NEGATIVE mg/dL   Specific Gravity, Urine 1.020 1.005 - 1.030   Hgb urine dipstick NEGATIVE NEGATIVE   pH 7.5 5.0 - 8.0   Protein, ur 30 (A) NEGATIVE mg/dL   Urobilinogen, UA 0.2 0.0 - 1.0 mg/dL   Nitrite NEGATIVE NEGATIVE   Leukocytes, UA NEGATIVE NEGATIVE    Comment: Biochemical Testing Only. Please order routine urinalysis from main lab if confirmatory testing is needed.  Pregnancy, urine POC     Status: None   Collection Time: 06/09/17  4:34 PM  Result Value Ref Range   Preg Test, Ur NEGATIVE NEGATIVE    Comment:        THE SENSITIVITY OF THIS METHODOLOGY IS >24 mIU/mL      ASSESSMENT AND PLAN:  Generalized abdominal pain -  Reassuring vitals and exam.  Will treat symptomatically for now.  RTC precautions discussed.  The patient is advised to call or return to clinic if she does not see an improvement in symptoms, or to seek the care of the closest emergency department if she worsens with the above plan.   Deliah Boston, MHS, PA-C 06/09/2017 5:08 PM    Mariah Neas, PA-C 06/09/17 1708    Mariah Neas, PA-C 06/09/17 1708

## 2017-06-09 NOTE — ED Triage Notes (Signed)
Pt  Reports   sev  Days   Ago  Had   Symptoms  Of  Sneezing   Coughing   Headache         And  Developed  A   Stomachache   Today          Had   Diarrhea  yest   Nausea  Today

## 2017-06-16 NOTE — Progress Notes (Signed)
HPI:   Ms.Prima Weyman Croon Vanderwerf is a 21 y.o. female, who is here today with her mother for 3 months follow up.  She was seen on 04/09/17, since her last OV she has been in the ER c/o abdominal pain. 06/09/17 she presented to the ER because abdominal pain, sore throat,and headache.  Abdominal pain has resolved.  Denies abdominal pain, nausea, vomiting, changes in bowel habits, blood in stool or melena.  LMP: 05/19/17.  Headache,combination of migraine and tension like HA: She is currently on Amitriptyline 25 mg daily, increased from 12.5 mg last OV.  She is tolerating medication well. Side effects: None. Sleeping well, denies mood changes or suicidal thoughts.  She has had 1-2 "little headaches" since her last OV, she has not needed OTC analgesics.   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 She is not sure if vaccines are required or if she needs a form fill out.   Review of Systems  Constitutional: Negative for activity change, appetite change, fatigue and fever.  Eyes: Negative for redness and visual disturbance.  Respiratory: Negative for chest tightness, shortness of breath and wheezing.   Cardiovascular: Negative for palpitations and leg swelling.  Gastrointestinal: Negative for abdominal pain, nausea and vomiting.       Negative for changes in bowel habits.  Genitourinary: Negative for decreased urine volume and hematuria.  Neurological: Negative for syncope and weakness.  Psychiatric/Behavioral: Negative for confusion and sleep disturbance. The patient is not nervous/anxious.       Current Outpatient Prescriptions on File Prior to Visit  Medication Sig Dispense Refill  . SPRINTEC 28 0.25-35 MG-MCG tablet Take 1 tablet by mouth daily.  2   No current facility-administered medications on file prior to visit.      Past Medical History:  Diagnosis Date  . Allergy   . Asthma    No  Known Allergies  Social History   Social History  . Marital status: Single    Spouse name: N/A  . Number of children: N/A  . Years of education: N/A   Social History Main Topics  . Smoking status: Never Smoker  . Smokeless tobacco: Never Used  . Alcohol use No  . Drug use: No  . Sexual activity: Not Asked   Other Topics Concern  . None   Social History Narrative  . None    Vitals:   06/17/17 1121  BP: 110/80  Pulse: 88  Resp: 12   There is no height or weight on file to calculate BMI.   Physical Exam  Nursing note and vitals reviewed. Constitutional: She is oriented to person, place, and time. She appears well-developed and well-nourished. No distress.  HENT:  Head: Atraumatic.  Mouth/Throat: Oropharynx is clear and moist and mucous membranes are normal.  Eyes: Pupils are equal, round, and reactive to light. Conjunctivae and EOM are normal.  Cardiovascular: Normal rate and regular rhythm.   No murmur heard. Pulses:      Dorsalis pedis pulses are 2+ on the right side, and 2+ on the left side.  Respiratory: Effort normal and breath sounds normal. No respiratory distress.  GI: Soft. Bowel sounds are normal. She exhibits no mass. There is no hepatomegaly. There is no tenderness.  Musculoskeletal: She exhibits no edema.  Lymphadenopathy:    She has no cervical adenopathy.  Neurological: She is alert and oriented to person, place, and time. She  has normal strength. Coordination and gait normal.  Skin: Skin is warm. No erythema.  Psychiatric: She has a normal mood and affect.  Well groomed, good eye contact.     ASSESSMENT AND PLAN:   Rexene AlbertsBridgett was seen today for follow-up.  Diagnoses and all orders for this visit:  Migraine without aura and without status migrainosus, not intractable  She has not had any episode sine medication was started. No changes in current management. F/U in 6 months.  -     amitriptyline (ELAVIL) 25 MG tablet; Take 1 tablet (25 mg  total) by mouth at bedtime.  Tension headache  Greatly improved. No changes in Amitriptyline dose, some side effects discussed.  -     amitriptyline (ELAVIL) 25 MG tablet; Take 1 tablet (25 mg total) by mouth at bedtime.  Abdominal pain, unspecified abdominal location  Resolved. Instructed about warning signs. F/U as needed.  Healthcare maintenance  Vaccines up to date,copy or vaccine records given. If she needs college form fill out or PPD done,she was instructed to drop it off.    15 min face to face OV. > 50% was dedicated to reviewing Dx, prognosis, and side effects of medications.We also dedicated some time on discussion of general safety measures, injury prevention, and avoidance of risky behavior.     Demontre Padin G. [place], MD  [facility]. [place] office.

## 2017-06-17 ENCOUNTER — Encounter: Payer: Self-pay | Admitting: Family Medicine

## 2017-06-17 ENCOUNTER — Ambulatory Visit (INDEPENDENT_AMBULATORY_CARE_PROVIDER_SITE_OTHER): Payer: No Typology Code available for payment source | Admitting: Family Medicine

## 2017-06-17 VITALS — BP 110/80 | HR 88 | Resp 12 | Ht 60.01 in

## 2017-06-17 DIAGNOSIS — Z0000 Encounter for general adult medical examination without abnormal findings: Secondary | ICD-10-CM | POA: Diagnosis not present

## 2017-06-17 DIAGNOSIS — R109 Unspecified abdominal pain: Secondary | ICD-10-CM

## 2017-06-17 DIAGNOSIS — G44209 Tension-type headache, unspecified, not intractable: Secondary | ICD-10-CM

## 2017-06-17 DIAGNOSIS — G43009 Migraine without aura, not intractable, without status migrainosus: Secondary | ICD-10-CM

## 2017-06-17 MED ORDER — AMITRIPTYLINE HCL 25 MG PO TABS: 25.0000 mg | ORAL_TABLET | Freq: Every day | ORAL | 2 refills | Status: DC

## 2017-06-17 NOTE — Patient Instructions (Signed)
A few things to remember from today's visit:   Migraine without aura and without status migrainosus, not intractable  Tension headache - Plan: amitriptyline (ELAVIL) 25 MG tablet   Mariah Haas, today we have followed on some of your chronic medical problems and they seem to be stable, so no changes in current management today.  Review medication list and be sure it is accurate.  -Remember a healthy diet and regular physical activity are very important for prevention as well as for well being; they also help with many chronic problems, decreasing the need of adding new medications and delaying or preventing possible complications.  I will see you back in 6 months.  Remember to arrange your follow up appt before leaving today.  Please follow sooner than planned if a new concern arises.   Please be sure medication list is accurate. If a new problem present, please set up appointment sooner than planned today.

## 2017-08-05 ENCOUNTER — Ambulatory Visit (HOSPITAL_COMMUNITY)
Admission: EM | Admit: 2017-08-05 | Discharge: 2017-08-05 | Disposition: A | Discharge status: Home / Self Care | Payer: No Typology Code available for payment source | Attending: Urgent Care | Admitting: Urgent Care

## 2017-08-05 ENCOUNTER — Encounter (HOSPITAL_COMMUNITY): Payer: Self-pay | Admitting: Emergency Medicine

## 2017-08-05 DIAGNOSIS — H9201 Otalgia, right ear: Secondary | ICD-10-CM | POA: Diagnosis not present

## 2017-08-05 DIAGNOSIS — J029 Acute pharyngitis, unspecified: Secondary | ICD-10-CM | POA: Diagnosis not present

## 2017-08-05 LAB — POCT RAPID STREP A: STREPTOCOCCUS, GROUP A SCREEN (DIRECT): NEGATIVE

## 2017-08-05 MED ORDER — PSEUDOEPHEDRINE HCL ER 120 MG PO TB12: 120.0000 mg | ORAL_TABLET | Freq: Two times a day (BID) | ORAL | 3 refills | Status: DC

## 2017-08-05 NOTE — ED Triage Notes (Signed)
Complains of right ear pain and right side of throat.  Pain particularly last night.  Reports brief episode of right ear pain last week.

## 2017-08-05 NOTE — ED Provider Notes (Signed)
  MRN: [id number] DOB: [birth year]  Subjective:   Mariah Haas is a 21 y.o. female presenting for chief complaint of Otalgia  Reports 1 day history of sore throat, difficulty swallowing, right sided facial pain, right ear pain. Had an episode of right ear pain 1 week ago that resolved on its own. Has not tried any medications for relief. Denies fever, sinus congestions, ear drainage, tinnitus, dizziness, cough. Admits history of allergies but does not take anything consistently for this. Denies smoking cigarettes.  No current facility-administered medications for this encounter.   Current Outpatient Prescriptions:  .  amitriptyline (ELAVIL) 25 MG tablet, Take 1 tablet (25 mg total) by mouth at bedtime., Disp: 90 tablet, Rfl: 2 .  SPRINTEC 28 0.25-35 MG-MCG tablet, Take 1 tablet by mouth daily., Disp: , Rfl: 2   Colandra has No Known Allergies.  Inanna  has a past medical history of Allergy and Asthma. Denies past surgical history.  Objective:   Vitals: BP 110/66 (BP Location: Left Arm)   Pulse 93   Temp 98.4 F (36.9 C) (Oral)   Resp 18   SpO2 100%   Physical Exam  Constitutional: She is oriented to person, place, and time. She appears well-developed and well-nourished.  HENT:  TM's intact bilaterally, no effusions or erythema. Nasal turbinates pink and moist, nasal passages patent. No sinus tenderness. Oropharynx with slight erythema but no exudates, mucous membranes moist.  Eyes: Right eye exhibits no discharge. Left eye exhibits no discharge. No scleral icterus.  Neck: Normal range of motion. Neck supple.  Cardiovascular: Normal rate.   Pulmonary/Chest: Effort normal.  Lymphadenopathy:    She has cervical adenopathy.  Neurological: She is alert and oriented to person, place, and time.  Skin: Skin is warm and dry.   Results for orders placed or performed during the hospital encounter of 08/05/17 (from the past 24 hour(s))  POCT rapid strep A [facility])      Status: None   Collection Time: 08/05/17 11:30 AM  Result Value Ref Range   Streptococcus, Group A Screen (Direct) NEGATIVE NEGATIVE   Assessment and Plan :   Sore throat  Otalgia of right ear  - Likely viral in nature, advised supportive care. - If no improvement or symptoms do not resolve return to clinic in 1 week.   Wallis Bamberg, PA-C [facility]  08/05/2017  11:11 AM    Wallis Bamberg, PA-C 08/05/17 1134

## 2017-08-07 LAB — CULTURE, GROUP A STREP (THRC)

## 2017-08-18 ENCOUNTER — Emergency Department (HOSPITAL_BASED_OUTPATIENT_CLINIC_OR_DEPARTMENT_OTHER): Payer: No Typology Code available for payment source

## 2017-08-18 ENCOUNTER — Encounter (HOSPITAL_BASED_OUTPATIENT_CLINIC_OR_DEPARTMENT_OTHER): Payer: Self-pay | Admitting: Emergency Medicine

## 2017-08-18 ENCOUNTER — Emergency Department (HOSPITAL_BASED_OUTPATIENT_CLINIC_OR_DEPARTMENT_OTHER)
Admission: EM | Admit: 2017-08-18 | Discharge: 2017-08-18 | Disposition: A | Discharge status: Home / Self Care | Payer: No Typology Code available for payment source | Attending: Emergency Medicine | Admitting: Emergency Medicine

## 2017-08-18 DIAGNOSIS — J45909 Unspecified asthma, uncomplicated: Secondary | ICD-10-CM | POA: Insufficient documentation

## 2017-08-18 DIAGNOSIS — J019 Acute sinusitis, unspecified: Secondary | ICD-10-CM | POA: Diagnosis not present

## 2017-08-18 DIAGNOSIS — R05 Cough: Secondary | ICD-10-CM | POA: Diagnosis present

## 2017-08-18 DIAGNOSIS — Z79899 Other long term (current) drug therapy: Secondary | ICD-10-CM | POA: Diagnosis not present

## 2017-08-18 LAB — MONONUCLEOSIS SCREEN: Mono Screen: NEGATIVE

## 2017-08-18 LAB — RAPID STREP SCREEN (MED CTR MEBANE ONLY): STREPTOCOCCUS, GROUP A SCREEN (DIRECT): NEGATIVE

## 2017-08-18 MED ORDER — AMOXICILLIN-POT CLAVULANATE 875-125 MG PO TABS: 1.0000 | ORAL_TABLET | Freq: Two times a day (BID) | ORAL | 0 refills | Status: DC

## 2017-08-18 MED ORDER — FLUTICASONE PROPIONATE 50 MCG/ACT NA SUSP: 1.0000 | Freq: Every day | NASAL | 2 refills | Status: DC

## 2017-08-18 NOTE — ED Provider Notes (Signed)
[facility] Provider Note   CSN: [id number] Arrival date & time: 08/18/17  1713     History   Chief Complaint Chief Complaint  Patient presents with  . Cough    HPI Mariah Haas is a 21 y.o. female.  HPI Patient presents to ED for evaluation of a 2 week history of cough that has now become productive, gradually worsening abdominal pain, congestion, sore throat. She was seen at urgent care a few days after symptoms began and was discharged with symptomatic treatment for sinus infection and ear pain. She has been taking her Sudafed but reports no improvement in symptoms. She reports chills. She denies any changes in appetite. She denies any sick contacts. She also reports 1 episode of blood-tinged mucus yesterday. She denies any chest pain, shortness of breath, vomiting, bowel changes, urinary symptoms. She denies any recent prolonged travel, recent surgery.  Past Medical History:  Diagnosis Date  . Allergy   . Asthma     Patient Active Problem List   Diagnosis Date Noted  . Allergic rhinitis 03/18/2017  . Migraine without aura and without status migrainosus, not intractable 01/28/2014    History reviewed. No pertinent surgical history.  OB History    No data available       Home Medications    Prior to Admission medications   Medication Sig Start Date End Date Taking? Authorizing Provider  amitriptyline (ELAVIL) 25 MG tablet Take 1 tablet (25 mg total) by mouth at bedtime. 06/17/17   [place], Betty G, MD  amoxicillin-clavulanate (AUGMENTIN) 875-125 MG tablet Take 1 tablet by mouth every 12 (twelve) hours. 08/18/17   Sydell Prowell, PA-C  fluticasone (FLONASE) 50 MCG/ACT nasal spray Place 1 spray into both nostrils daily. 08/18/17   Tashina Credit, PA-C  pseudoephedrine (SUDAFED 12 HOUR) 120 MG 12 hr tablet Take 1 tablet (120 mg total) by mouth 2 (two) times daily. 08/05/17   Wallis Bamberg, PA-C  SPRINTEC 28 0.25-35 MG-MCG tablet Take 1 tablet by mouth daily.  07/28/16   [provider]    Family History Family History  Problem Relation Age of Onset  . Seizures Mother   . Bipolar disorder Mother   . Anxiety disorder Mother   . Migraines Mother   . Headache Mother   . Diabetes Mother   . Lung cancer Maternal Grandmother   . Migraines Maternal Grandfather   . Bipolar disorder Maternal Grandfather   . Seizures Maternal Aunt        Febrile Szs as an infant  . Migraines Maternal Aunt   . Bipolar disorder Maternal Aunt   . Anxiety disorder Maternal Aunt   . Cancer Maternal Aunt        lung  . Cancer Paternal Uncle        Astrocytoma    Social History Social History  Substance Use Topics  . Smoking status: Never Smoker  . Smokeless tobacco: Never Used  . Alcohol use No     Allergies   Patient has no known allergies.   Review of Systems Review of Systems  Constitutional: Negative for appetite change, chills and fever.  HENT: Positive for congestion, ear pain, rhinorrhea and sore throat. Negative for sneezing.   Eyes: Negative for photophobia and visual disturbance.  Respiratory: Positive for cough. Negative for chest tightness, shortness of breath and wheezing.   Cardiovascular: Negative for chest pain and palpitations.  Gastrointestinal: Positive for abdominal pain and nausea. Negative for blood in stool, constipation, diarrhea and vomiting.  Genitourinary: Negative for dysuria, hematuria and urgency.  Musculoskeletal: Negative for myalgias.  Skin: Negative for rash.  Neurological: Negative for dizziness, weakness and light-headedness.     Physical Exam Updated Vital Signs BP 110/69   Pulse 69   Temp 99 F (37.2 C)   Resp 18   Ht  (1.549 m)   Wt 44 kg (97 lb)   LMP 08/04/2017   SpO2 99%   BMI 18.33 kg/m   Physical Exam  Constitutional: She appears well-developed and well-nourished. No distress.  HENT:  Head: Normocephalic and atraumatic.  Right Ear: Tympanic membrane normal.  Left Ear: Tympanic  membrane normal.  Nose: Mucosal edema present. Right sinus exhibits maxillary sinus tenderness. Right sinus exhibits no frontal sinus tenderness. Left sinus exhibits maxillary sinus tenderness. Left sinus exhibits no frontal sinus tenderness.  Mouth/Throat: Uvula is midline. Posterior oropharyngeal erythema present. No posterior oropharyngeal edema. No tonsillar exudate.  Patient does not appear to be in acute distress. No trismus or drooling present. No pooling of secretions. Patient is tolerating secretions and is not in respiratory distress. No neck pain or tenderness to palpation of the neck. Full active and passive range of motion of the neck. No evidence of RPA or PTA.  Eyes: Conjunctivae and EOM are normal. Left eye exhibits no discharge. No scleral icterus.  Neck: Normal range of motion. Neck supple.  Cardiovascular: Normal rate, regular rhythm, normal heart sounds and intact distal pulses.  Exam reveals no gallop and no friction rub.   No murmur heard. Pulmonary/Chest: Effort normal and breath sounds normal. No respiratory distress.  Abdominal: Soft. Bowel sounds are normal. She exhibits no distension. There is no tenderness. There is no guarding.  Musculoskeletal: Normal range of motion. She exhibits no edema.  Neurological: She is alert. She exhibits normal muscle tone. Coordination normal.  Skin: Skin is warm and dry. No rash noted.  Psychiatric: She has a normal mood and affect.  Nursing note and vitals reviewed.    ED Treatments / Results  Labs (all labs ordered are listed, but only abnormal results are displayed) Labs Reviewed  RAPID STREP SCREEN (NOT AT [facility])  CULTURE, GROUP A STREP [facility])  MONONUCLEOSIS SCREEN    EKG  EKG Interpretation None       Radiology Dg Chest 2 View  Result Date: 08/18/2017 CLINICAL DATA:  Cough and congestion.  Sinus pressure. EXAM: CHEST  2 VIEW COMPARISON:  10/17/2016 FINDINGS: The heart size and mediastinal contours are within normal  limits. Both lungs are clear. The visualized skeletal structures are unremarkable. IMPRESSION: No active cardiopulmonary disease. Electronically Signed   By: Signa Kell M.D.   On: 08/18/2017 19:01    Procedures Procedures (including critical care time)  Medications Ordered in ED Medications - No data to display   Initial Impression / Assessment and Plan / ED Course  I have reviewed the triage vital signs and the nursing notes.  Pertinent labs & imaging results that were available during my care of the patient were reviewed by me and considered in my medical decision making (see chart for details).     Patient presents today for evaluation of 2 week history of cough, congestion, sore throat. No improvement in symptoms with Sudafed. Reports intermittent chills. On physical exam there is some mild erythema noted in the posterior pharynx. Lungs are clear to auscultation bilaterally. She does have maxillary sinus tenderness. She is afebrile here. Heart rate and oxygen saturations normal. Chest x-ray negative. Strep test and mono  test negative. Will treat patient for sinusitis as his symptoms have been going on for 2 weeks. I have low suspicion for surgical cause for her abdominal pain based on the absence of tenderness today. Advised patient to take Augmentin twice daily and will give Flonase for symptomatic treatment. Patient appears stable for discharge at this time. Strict return precautions given.  Final Clinical Impressions(s) / ED Diagnoses   Final diagnoses:  Acute sinusitis, recurrence not specified, unspecified location    New Prescriptions New Prescriptions   AMOXICILLIN-CLAVULANATE (AUGMENTIN) 875-125 MG TABLET    Take 1 tablet by mouth every 12 (twelve) hours.   FLUTICASONE (FLONASE) 50 MCG/ACT NASAL SPRAY    Place 1 spray into both nostrils daily.     Dietrich Pates, PA-C 08/18/17 2012    Melene Plan, DO 08/18/17 2326

## 2017-08-18 NOTE — ED Triage Notes (Signed)
Patient states that she went to the urgent care about 2 weeks ago and was dx with sinus infection and ear infection. PAtient reports that it is getting worse and she now has a cough and her stomach is upset. The patient reports that yesterday she coughed up blood

## 2017-08-18 NOTE — Discharge Instructions (Signed)
Please read attached information regarding your condition. Take Augmentin twice daily for 7 days. Take Flonase as needed for nasal congestion. Take ibuprofen or Tylenol as needed for pain and fever. Follow-up with PCP for further evaluation. Return to ED for worsening cough, chest pain, shortness of breath, trouble swallowing, neck pain.

## 2017-08-18 NOTE — ED Notes (Signed)
Patient transported to X-ray 

## 2017-08-21 ENCOUNTER — Ambulatory Visit (HOSPITAL_COMMUNITY)
Admission: EM | Admit: 2017-08-21 | Discharge: 2017-08-21 | Disposition: A | Discharge status: Home / Self Care | Payer: No Typology Code available for payment source | Attending: Family Medicine | Admitting: Family Medicine

## 2017-08-21 ENCOUNTER — Encounter (HOSPITAL_COMMUNITY): Payer: Self-pay | Admitting: Emergency Medicine

## 2017-08-21 DIAGNOSIS — J069 Acute upper respiratory infection, unspecified: Secondary | ICD-10-CM | POA: Diagnosis not present

## 2017-08-21 DIAGNOSIS — B9789 Other viral agents as the cause of diseases classified elsewhere: Secondary | ICD-10-CM | POA: Diagnosis not present

## 2017-08-21 LAB — CULTURE, GROUP A STREP (THRC)

## 2017-08-21 MED ORDER — HYDROCODONE-HOMATROPINE 5-1.5 MG/5ML PO SYRP: 5.0000 mL | ORAL_SOLUTION | Freq: Four times a day (QID) | ORAL | 0 refills | Status: DC | PRN

## 2017-08-21 NOTE — ED Triage Notes (Signed)
PT reports cough for 1 week. PT reports cough is productive. PT reports sore throat as well. PT started amoxicillin Sunday.

## 2017-08-24 NOTE — ED Provider Notes (Signed)
[facility]   [id number] 08/21/17 Arrival Time: 1356  ASSESSMENT & PLAN:  1. Viral URI with cough     Meds ordered this encounter  Medications  . HYDROcodone-homatropine (HYCODAN) 5-1.5 MG/5ML syrup    Sig: Take 5 mLs by mouth every 6 (six) hours as needed for cough.    Dispense:  60 mL    Refill:  0    OTC analgesics and symptom care as needed. [place] Controlled Substances Registry consulted for this patient. I feel the risk/benefit ratio today is favorable for proceeding with this prescription for a controlled substance. Medication sedation precautions given.  Reviewed expectations re: course of current medical issues. Questions answered. Outlined signs and symptoms indicating need for more acute intervention. Patient verbalized understanding. After Visit Summary given.   SUBJECTIVE:  Mariah Haas is a 21 y.o. female who presents with complaint of nasal congestion, post-nasal drainage, and a persistent cough. Afebrile. Cough affecting sleep. Started on amoxicillin 2 days ago. Mild ST. Overall duration approx 1 week. OTC without help.  ROS: As per HPI.   OBJECTIVE:  Vitals:   08/21/17 1407 08/21/17 1408  BP: 104/69   Pulse: 62   Resp: 16   Temp: 98.6 F (37 C)   TempSrc: Oral   SpO2: 100%   Weight:  97 lb (44 kg)  Height:  5' (1.524 m)     General appearance: alert; no distress HEENT: nasal congestion; clear runny nose; throat irritation secondary to post-nasal drainage Neck: supple without LAD Lungs: clear to auscultation bilaterally Skin: warm and dry Psychological: alert and cooperative; normal mood and affect  Results for orders placed or performed during the hospital encounter of 08/18/17  Rapid strep screen  Result Value Ref Range   Streptococcus, Group A Screen (Direct) NEGATIVE NEGATIVE  Culture, group A strep  Result Value Ref Range   Specimen Description THROAT    Special Requests NONE Reflexed from [id number]    Culture      NO  GROUP A STREP (S.PYOGENES) ISOLATED Performed at [facility], [address], [place], [place] [address]    Report Status 08/21/2017 FINAL   Mononucleosis screen  Result Value Ref Range   Mono Screen NEGATIVE NEGATIVE    No Known Allergies  Past Medical History:  Diagnosis Date  . Allergy   . Asthma    Social History   Social History  . Marital status: Single    Spouse name: N/A  . Number of children: N/A  . Years of education: N/A   Occupational History  . Not on file.   Social History Main Topics  . Smoking status: Never Smoker  . Smokeless tobacco: Never Used  . Alcohol use No  . Drug use: No  . Sexual activity: Not on file   Other Topics Concern  . Not on file   Social History Narrative  . No narrative on file   Family History  Problem Relation Age of Onset  . Seizures Mother   . Bipolar disorder Mother   . Anxiety disorder Mother   . Migraines Mother   . Headache Mother   . Diabetes Mother   . Lung cancer Maternal Grandmother   . Migraines Maternal Grandfather   . Bipolar disorder Maternal Grandfather   . Seizures Maternal Aunt        Febrile Szs as an infant  . Migraines Maternal Aunt   . Bipolar disorder Maternal Aunt   . Anxiety disorder Maternal Aunt   .  Cancer Maternal Aunt        lung  . Cancer Paternal Uncle        Astrocytoma           Mardella Layman, MD 08/24/17 [phone]

## 2017-09-26 ENCOUNTER — Encounter (HOSPITAL_COMMUNITY): Payer: Self-pay | Admitting: Emergency Medicine

## 2017-09-26 ENCOUNTER — Ambulatory Visit (HOSPITAL_COMMUNITY)
Admission: EM | Admit: 2017-09-26 | Discharge: 2017-09-26 | Disposition: A | Discharge status: Home / Self Care | Payer: No Typology Code available for payment source | Attending: Family Medicine | Admitting: Family Medicine

## 2017-09-26 DIAGNOSIS — J4521 Mild intermittent asthma with (acute) exacerbation: Secondary | ICD-10-CM

## 2017-09-26 MED ORDER — MONTELUKAST SODIUM 10 MG PO TABS: 10.0000 mg | ORAL_TABLET | Freq: Every day | ORAL | 1 refills | Status: DC

## 2017-09-26 NOTE — Discharge Instructions (Signed)
Symptoms are mild and should resolve with the Singulair.  Return in a week if they continue, sooner if worsening

## 2017-09-26 NOTE — ED Provider Notes (Signed)
[facility]   [id number] 09/26/17 Arrival Time: 1734   SUBJECTIVE:  Mariah Haas is a 21 y.o. female who presents to the urgent care with complaint of intermittent tightness in her chest for 2 weeks. Patient has a history of asthma and normally goes to school in dorm. [place] from [place].  Patient's also had some stuffiness. There's been no fever. She's had a mild cough and no significant wheezing     Past Medical History:  Diagnosis Date  . Allergy   . Asthma    Family History  Problem Relation Age of Onset  . Seizures Mother   . Bipolar disorder Mother   . Anxiety disorder Mother   . Migraines Mother   . Headache Mother   . Diabetes Mother   . Lung cancer Maternal Grandmother   . Migraines Maternal Grandfather   . Bipolar disorder Maternal Grandfather   . Seizures Maternal Aunt        Febrile Szs as an infant  . Migraines Maternal Aunt   . Bipolar disorder Maternal Aunt   . Anxiety disorder Maternal Aunt   . Cancer Maternal Aunt        lung  . Cancer Paternal Uncle        Astrocytoma   Social History   Socioeconomic History  . Marital status: Single    Spouse name: Not on file  . Number of children: Not on file  . Years of education: Not on file  . Highest education level: Not on file  Social Needs  . Financial resource strain: Not on file  . Food insecurity - worry: Not on file  . Food insecurity - inability: Not on file  . Transportation needs - medical: Not on file  . Transportation needs - non-medical: Not on file  Occupational History  . Not on file  Tobacco Use  . Smoking status: Never Smoker  . Smokeless tobacco: Never Used  Substance and Sexual Activity  . Alcohol use: No  . Drug use: No  . Sexual activity: Not on file  Other Topics Concern  . Not on file  Social History Narrative  . Not on file   Current Meds  Medication Sig  . amitriptyline (ELAVIL) 25 MG tablet Take 1 tablet (25 mg total) by mouth at bedtime.   . SPRINTEC 28 0.25-35 MG-MCG tablet Take 1 tablet by mouth daily.   No Known Allergies    ROS: As per HPI, remainder of ROS negative.   OBJECTIVE:   Vitals:   09/26/17 1809 09/26/17 1811  BP: 108/64   Pulse: 68   Resp: 16   Temp: 98.8 F (37.1 C)   TempSrc: Oral   SpO2: 100%   Weight:  98 lb (44.5 kg)  Height:  5' (1.524 m)     General appearance: alert; no distress Eyes: PERRL; EOMI; conjunctiva normal HENT: normocephalic; atraumatic; TMs normal, canal normal, external ears normal without trauma; nasal mucosa normal; oral mucosa normal Neck: supple Lungs: clear to auscultation bilaterally Heart: regular rate and rhythm Abdomen: soft, non-tender; bowel sounds normal; no masses or organomegaly; no guarding or rebound tenderness Back: no CVA tenderness Extremities: no cyanosis or edema; symmetrical with no gross deformities Skin: warm and dry Neurologic: normal gait; grossly normal Psychological: alert and cooperative; normal mood and affect      Labs:  Results for orders placed or performed during the hospital encounter of 08/18/17  Rapid strep screen  Result Value Ref Range   Streptococcus,  Group A Screen (Direct) NEGATIVE NEGATIVE  Culture, group A strep  Result Value Ref Range   Specimen Description THROAT    Special Requests NONE Reflexed from [id number]    Culture      NO GROUP A STREP (S.PYOGENES) ISOLATED Performed at [facility], [address] [place], [place] [id number]    Report Status 08/21/2017 FINAL   Mononucleosis screen  Result Value Ref Range   Mono Screen NEGATIVE NEGATIVE    Labs Reviewed - No data to display  No results found.     ASSESSMENT & PLAN:  1. Mild intermittent asthma with exacerbation     Meds ordered this encounter  Medications  . montelukast (SINGULAIR) 10 MG tablet    Sig: Take 1 tablet (10 mg total) at bedtime by mouth.    Dispense:  30 tablet    Refill:  1    Reviewed expectations re: course of  current medical issues. Questions answered. Outlined signs and symptoms indicating need for more acute intervention. Patient verbalized understanding. After Visit Summary given.    Procedures:      Elvina SidleLauenstein, Meridee Branum, MD 09/26/17 1919

## 2017-09-26 NOTE — ED Triage Notes (Signed)
PT reports SOB for a few weeks. PT reports dizziness sometimes with position changes. PT reports she used to take asthma medication.

## 2017-11-04 ENCOUNTER — Ambulatory Visit: Payer: Self-pay | Admitting: Allergy and Immunology

## 2017-11-08 ENCOUNTER — Encounter: Payer: Self-pay | Admitting: Allergy

## 2017-11-08 ENCOUNTER — Ambulatory Visit (INDEPENDENT_AMBULATORY_CARE_PROVIDER_SITE_OTHER): Payer: No Typology Code available for payment source | Admitting: Allergy

## 2017-11-08 VITALS — BP 104/60 | HR 70 | Ht 59.5 in | Wt 93.6 lb

## 2017-11-08 DIAGNOSIS — J4520 Mild intermittent asthma, uncomplicated: Secondary | ICD-10-CM | POA: Diagnosis not present

## 2017-11-08 DIAGNOSIS — J3089 Other allergic rhinitis: Secondary | ICD-10-CM

## 2017-11-08 DIAGNOSIS — T781XXA Other adverse food reactions, not elsewhere classified, initial encounter: Secondary | ICD-10-CM

## 2017-11-08 MED ORDER — ALBUTEROL SULFATE HFA 108 (90 BASE) MCG/ACT IN AERS: 1.0000 | INHALATION_SPRAY | Freq: Four times a day (QID) | RESPIRATORY_TRACT | 5 refills | Status: DC | PRN

## 2017-11-08 MED ORDER — FLUTICASONE PROPIONATE HFA 110 MCG/ACT IN AERO: 2.0000 | INHALATION_SPRAY | Freq: Two times a day (BID) | RESPIRATORY_TRACT | 5 refills | Status: DC

## 2017-11-08 NOTE — Progress Notes (Addendum)
New Patient Note  RE: Mariah Haas MRN: [id number] DOB: 11/06/1996 Date of Office Visit: 11/08/2017  Referring provider: [place], Betty G, MD Primary care provider: [place], Betty G, MD  Chief Complaint: asthma  History of present illness: Mariah SchimkeBridgett N Clagett is a 21 y.o. female presenting today for consultation for asthma.  She presents today with her mother.  She is a former pt of our practice but has not been for follow-up in greater than 3 years.      Mother reports she was diagnosed with asthma around toddler age.  She recalls her only having albuterol inhalers in the past.  She has required no hospitalizations for asthma.  She states she had been doing well up until about October and feels that the change in weather may be affecting her breathing.  She states she has been having SOB, chest tightness and cough.  She went to UC about a month ago where she was prescribed singulair.  She does not have albuterol or other inhalers.  She does not feel the singulair has helped that much since starting.  She denies any nighttime awakenings.  No oral steroids. No fevers.  Mother  thinks the last major asthma flare she was about 8yo.     Mother reports she has history of environmental allergies (sneezing, nasal congestion and drainage).  She has taken benadryl for this but denies use of long-acting antihistamine or any nasal sprays.        No history of eczema.  She does report with fresh blueberries her mouth gets tingly and she does try to avoid these.  She can eat blueberry muffins without problem.  She does not recall when she first noted issues with blueberries but does feel she has been avoiding them for the past decade.    Review of systems: Review of Systems  Constitutional: Negative for chills, fever and malaise/fatigue.  HENT: Negative for congestion, ear discharge, ear pain, nosebleeds, sinus pain, sore throat and tinnitus.   Eyes: Negative for pain, discharge and redness.    Respiratory: Positive for cough and shortness of breath. Negative for sputum production and wheezing.   Cardiovascular: Negative for chest pain.  Gastrointestinal: Negative for abdominal pain, constipation, diarrhea, heartburn, nausea and vomiting.  Musculoskeletal: Negative for joint pain and myalgias.  Skin: Negative for itching and rash.  Neurological: Negative for dizziness and headaches.    All other systems negative unless noted above in HPI  Past medical history: Past Medical History:  Diagnosis Date  . Allergy   . Asthma     Past surgical history: Past Surgical History:  Procedure Laterality Date  . NO PAST SURGERIES      Family history:  Family History  Problem Relation Age of Onset  . Seizures Mother   . Bipolar disorder Mother   . Anxiety disorder Mother   . Migraines Mother   . Headache Mother   . Diabetes Mother   . Lung cancer Maternal Grandmother   . Migraines Maternal Grandfather   . Bipolar disorder Maternal Grandfather   . Seizures Maternal Aunt        Febrile Szs as an infant  . Migraines Maternal Aunt   . Bipolar disorder Maternal Aunt   . Anxiety disorder Maternal Aunt   . Cancer Maternal Aunt        lung  . Cancer Paternal Uncle        Astrocytoma    Social history: She lives in a home without carpeting  with gas heating and central cooling.  There are dogs in the home.  She has no concern for water damage, mildew or roaches in the home.  She works as a LawyerCNA.  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     Medication List: Allergies as of 11/08/2017   No Known Allergies     Medication List        Accurate as of 11/08/17  4:56 PM. Always use your most recent med list.          SPRINTEC 28 0.25-35 MG-MCG tablet Generic drug:  norgestimate-ethinyl estradiol Take 1 tablet by mouth daily.       Known medication allergies: No Known Allergies   Physical examination: Blood pressure 104/60, pulse 70, height 4' 11.5" (1.511 m),  weight 93 lb 9.6 oz (42.5 kg), SpO2 97 %.  General: Alert, interactive, in no acute distress. HEENT: PERRLA, TMs pearly gray, turbinates mildly edematous without discharge, post-pharynx non erythematous. Neck: Supple without lymphadenopathy. Lungs: Clear to auscultation without wheezing, rhonchi or rales. {no increased work of breathing. CV: Normal S1, S2 without murmurs. Abdomen: Nondistended, nontender. Skin: Warm and dry, without lesions or rashes. Extremities:  No clubbing, cyanosis or edema. Neuro:   Grossly intact.  Diagnositics/Labs:  Spirometry: FEV1: 1.6L  59%, FVC: 1.63L post BD she had no significant improvement  Allergy testing: environmental skin prick testing is grasses, weeds, trees, molds, dust mites, cat, dog, mixed feathers, tobacco leaf Allergy testing results were read and interpreted by provider, documented by clinical staff.   Assessment and plan:   Asthma, mild intermittent    - have access to albuterol inhaler 2 puffs every 4-6 hours as needed for cough/wheeze/shortness of breath/chest tightness.  May use 15-20 minutes prior to activity.   Monitor frequency of use.       - continue singulair for now     - start Flovent 110 mcg 2 puffs twice a day with spacer    - Asthma control goals:   Full participation in all desired activities (may need albuterol before activity)  Albuterol use two time or less a week on average (not counting use with activity)  Cough interfering with sleep two time or less a month  Oral steroids no more than once a year  No hospitalizations  Allergic rhinitis    - environmental allergy skin testing is positive to grasses, weeds, trees, molds, dust mite, cat, dog, mixed feathers, tobacco leaf.  Allergen avoidance measures discussed today and provided with handouts    - recommend use of long-acting antihistamine like Zyrtec 10mg , Xyzal 5mg  or Allegra 180mg  daily for control of allergy symptoms (nasal congestion/drainage, sneezing,  itch, eye symptoms)    - for better control of nasal congestion/drainage recommend use of nasal steroid spray like Flonase, Rhinocort or Nasocort 1-2 sprays each nostril daily as needed.  Use for 1-2 weeks at a time before stopping once symptoms improve.     - as above continue singulair for now.    Pollen food allergy syndrome   - you have pollen sensitivity which can cause oral symptoms to certain foods (like fresh fruits or vegatables).    - continue to avoid fresh blueberries  Follow-up 3 months or sooner if needed   I appreciate the opportunity to take part in Sarayu's care. Please do not hesitate to contact me with questions.  Sincerely,   Margo AyeShaylar Namine Beahm, MD Allergy/Immunology [facility]

## 2017-11-08 NOTE — Patient Instructions (Addendum)
Asthma    - have access to albuterol inhaler 2 puffs every 4-6 hours as needed for cough/wheeze/shortness of breath/chest tightness.  May use 15-20 minutes prior to activity.   Monitor frequency of use.       - continue singulair for now     - start Flovent 110 mcg 2 puffs twice a day with spacer    - Asthma control goals:   Full participation in all desired activities (may need albuterol before activity)  Albuterol use two time or less a week on average (not counting use with activity)  Cough interfering with sleep two time or less a month  Oral steroids no more than once a year  No hospitalizations  Allergies    - environmental allergy skin testing is positive to grasses, weeds, trees, molds, dust mite, cat, dog, mixed feathers, tobacco leaf.  Allergen avoidance measures discussed today and provided with handouts    - recommend use of long-acting antihistamine like Zyrtec 10mg , Xyzal 5mg  or Allegra 180mg  daily for control of allergy symptoms (nasal congestion/drainage, sneezing, itch, eye symptoms)    - for better control of nasal congestion/drainage recommend use of nasal steroid spray like Flonase, Rhinocort or Nasocort 1-2 sprays each nostril daily as needed.  Use for 1-2 weeks at a time before stopping once symptoms improve.     - as above continue singulair for now.    Pollen food allergy syndrome   - you have pollen sensitivity which can cause oral symptoms to certain foods (like fresh fruits or vegatables).    - continue to avoid fresh blueberries  Follow-up 3 months or sooner if needed

## 2017-11-14 ENCOUNTER — Encounter: Payer: Self-pay | Admitting: Nurse Practitioner

## 2017-11-14 ENCOUNTER — Ambulatory Visit (INDEPENDENT_AMBULATORY_CARE_PROVIDER_SITE_OTHER): Payer: No Typology Code available for payment source | Admitting: Nurse Practitioner

## 2017-11-14 VITALS — BP 100/74 | HR 65 | Temp 97.4°F | Ht 59.5 in | Wt 93.0 lb

## 2017-11-14 DIAGNOSIS — J029 Acute pharyngitis, unspecified: Secondary | ICD-10-CM

## 2017-11-14 DIAGNOSIS — R110 Nausea: Secondary | ICD-10-CM

## 2017-11-14 DIAGNOSIS — B349 Viral infection, unspecified: Secondary | ICD-10-CM | POA: Diagnosis not present

## 2017-11-14 LAB — POCT RAPID STREP A (OFFICE): Rapid Strep A Screen: NEGATIVE

## 2017-11-14 LAB — POC INFLUENZA A&B (BINAX/QUICKVUE)
INFLUENZA A, POC: NEGATIVE
Influenza B, POC: NEGATIVE

## 2017-11-14 MED ORDER — FLUTICASONE PROPIONATE 50 MCG/ACT NA SUSP: 2.0000 | Freq: Every day | NASAL | 0 refills | Status: DC

## 2017-11-14 MED ORDER — GUAIFENESIN-DM 100-10 MG/5ML PO SYRP: 5.0000 mL | ORAL_SOLUTION | Freq: Four times a day (QID) | ORAL | 0 refills | Status: DC | PRN

## 2017-11-14 MED ORDER — ONDANSETRON HCL 4 MG PO TABS: 4.0000 mg | ORAL_TABLET | Freq: Three times a day (TID) | ORAL | 0 refills | Status: DC | PRN

## 2017-11-14 NOTE — Patient Instructions (Addendum)
URI Instructions: apply 2sprays of flonase in each nare for at least 14days.  Encourage adequate oral hydration.  Use over-the-counter  "cold" medicines  such as "Tylenol cold" , "Advil cold",  "Mucinex" or" Mucinex D"  for cough and congestion.  Avoid decongestants if you have high blood pressure. Use" Delsym" or" Robitussin" cough syrup varietis for cough.  You can use plain "Tylenol" or "Advi"l for fever, chills and achyness.   "Common cold" symptoms are usually triggered by a virus.  The antibiotics are usually not necessary. On average, a" viral cold" illness would take 4-7 days to resolve. Please, make an appointment if you are not better or if you're worse.  Negative for influenza and strep.   Food Choices to Help Relieve Diarrhea, Adult When you have diarrhea, the foods you eat and your eating habits are very important. Choosing the right foods and drinks can help:  Relieve diarrhea.  Replace lost fluids and nutrients.  Prevent dehydration.  What general guidelines should I follow? Relieving diarrhea  Choose foods with less than 2 g or .07 oz. of fiber per serving.  Limit fats to less than 8 tsp (38 g or 1.34 oz.) a day.  Avoid the following: ? Foods and beverages sweetened with high-fructose corn syrup, honey, or sugar alcohols such as xylitol, sorbitol, and mannitol. ? Foods that contain a lot of fat or sugar. ? Fried, greasy, or spicy foods. ? High-fiber grains, breads, and cereals. ? Raw fruits and vegetables.  Eat foods that are rich in probiotics. These foods include dairy products such as yogurt and fermented milk products. They help increase healthy bacteria in the stomach and intestines (gastrointestinal tract, or GI tract).  If you have lactose intolerance, avoid dairy products. These may make your diarrhea worse.  Take medicine to help stop diarrhea (antidiarrheal medicine) only as told by your health care provider. Replacing nutrients  Eat small meals  or snacks every 3-4 hours.  Eat bland foods, such as white rice, toast, or baked potato, until your diarrhea starts to get better. Gradually reintroduce nutrient-rich foods as tolerated or as told by your health care provider. This includes: ? Well-cooked protein foods. ? Peeled, seeded, and soft-cooked fruits and vegetables. ? Low-fat dairy products.  Take vitamin and mineral supplements as told by your health care provider. Preventing dehydration   Start by sipping water or a special solution to prevent dehydration (oral rehydration solution, ORS). Urine that is clear or pale yellow means that you are getting enough fluid.  Try to drink at least 8-10 cups of fluid each day to help replace lost fluids.  You may add other liquids in addition to water, such as clear juice or decaffeinated sports drinks, as tolerated or as told by your health care provider.  Avoid drinks with caffeine, such as coffee, tea, or soft drinks.  Avoid alcohol. What foods are recommended? The items listed may not be a complete list. Talk with your health care provider about what dietary choices are best for you. Grains White rice. White, JamaicaFrench, or pita breads (fresh or toasted), including plain rolls, buns, or bagels. White pasta. Saltine, soda, or graham crackers. Pretzels. Low-fiber cereal. Cooked cereals made with water (such as cornmeal, farina, or cream cereals). Plain muffins. Matzo. Melba toast. Zwieback. Vegetables Potatoes (without the skin). Most well-cooked and canned vegetables without skins or seeds. Tender lettuce. Fruits Apple sauce. Fruits canned in juice. Cooked apricots, cherries, grapefruit, peaches, pears, or plums. Fresh bananas and cantaloupe. Meats and  other protein foods Baked or boiled chicken. Eggs. Tofu. Fish. Seafood. Smooth nut butters. Ground or well-cooked tender beef, ham, veal, lamb, pork, or poultry. Dairy Plain yogurt, kefir, and unsweetened liquid yogurt. Lactose-free milk,  buttermilk, skim milk, or soy milk. Low-fat or nonfat hard cheese. Beverages Water. Low-calorie sports drinks. Fruit juices without pulp. Strained tomato and vegetable juices. Decaffeinated teas. Sugar-free beverages not sweetened with sugar alcohols. Oral rehydration solutions, if approved by your health care provider. Seasoning and other foods Bouillon, broth, or soups made from recommended foods. What foods are not recommended? The items listed may not be a complete list. Talk with your health care provider about what dietary choices are best for you. Grains Whole grain, whole wheat, bran, or rye breads, rolls, pastas, and crackers. Wild or brown rice. Whole grain or bran cereals. Barley. Oats and oatmeal. Corn tortillas or taco shells. Granola. Popcorn. Vegetables Raw vegetables. Fried vegetables. Cabbage, broccoli, Brussels sprouts, artichokes, baked beans, beet greens, corn, kale, legumes, peas, sweet potatoes, and yams. Potato skins. Cooked spinach and cabbage. Fruits Dried fruit, including raisins and dates. Raw fruits. Stewed or dried prunes. Canned fruits with syrup. Meat and other protein foods Fried or fatty meats. Deli meats. Chunky nut butters. Nuts and seeds. Beans and lentils. Tomasa BlaseBacon. Hot dogs. Sausage. Dairy High-fat cheeses. Whole milk, chocolate milk, and beverages made with milk, such as milk shakes. Half-and-half. Cream. sour cream. Ice cream. Beverages Caffeinated beverages (such as coffee, tea, soda, or energy drinks). Alcoholic beverages. Fruit juices with pulp. Prune juice. Soft drinks sweetened with high-fructose corn syrup or sugar alcohols. High-calorie sports drinks. Fats and oils Butter. Cream sauces. Margarine. Salad oils. Plain salad dressings. Olives. Avocados. Mayonnaise. Sweets and desserts Sweet rolls, doughnuts, and sweet breads. Sugar-free desserts sweetened with sugar alcohols such as xylitol and sorbitol. Seasoning and other foods Honey. Hot sauce. Chili  powder. Gravy. Cream-based or milk-based soups. Pancakes and waffles. Summary  When you have diarrhea, the foods you eat and your eating habits are very important.  Make sure you get at least 8-10 cups of fluid each day, or enough to keep your urine clear or pale yellow.  Eat bland foods and gradually reintroduce healthy, nutrient-rich foods as tolerated, or as told by your health care provider.  Avoid high-fiber, fried, greasy, or spicy foods. This information is not intended to replace advice given to you by your health care provider. Make sure you discuss any questions you have with your health care provider. Document Released: 01/26/2004 Document Revised: 11/02/2016 Document Reviewed: 11/02/2016 Elsevier Interactive Patient Education  [employer]2018 [employer] Inc.

## 2017-11-14 NOTE — Progress Notes (Signed)
Subjective:  Patient ID: Mariah Haas, female    DOB: 11/28/1995  Age: 21 y.o. MRN: [id number]  CC: Diarrhea (nose runny,ears burns,diarrhea 2 days/nausea/headahce--going on for 2 days. )   URI   This is a new problem. The current episode started in the past 7 days. The problem has been unchanged. There has been no fever. Associated symptoms include congestion, coughing, diarrhea, headaches, joint pain, nausea, rhinorrhea, sinus pain, sneezing, a sore throat and swollen glands. Pertinent negatives include no abdominal pain, dysuria, ear pain, joint swelling, neck pain, plugged ear sensation, rash, vomiting or wheezing. She has tried nothing for the symptoms.  had 1episode of diarrhea yesterday and today. No blood.  Accompanied by mother.  Outpatient Medications Prior to Visit  Medication Sig Dispense Refill  . albuterol (PROVENTIL HFA;VENTOLIN HFA) 108 (90 Base) MCG/ACT inhaler Inhale 1-2 puffs into the lungs every 6 (six) hours as needed for wheezing or shortness of breath. 1 Inhaler 5  . fluticasone (FLOVENT HFA) 110 MCG/ACT inhaler Inhale 2 puffs into the lungs 2 (two) times daily. 1 Inhaler 5  . SPRINTEC 28 0.25-35 MG-MCG tablet Take 1 tablet by mouth daily.  2   No facility-administered medications prior to visit.     ROS See HPI  Objective:  BP 100/74   Pulse 65   Temp (!) 97.4 F (36.3 C) (Oral)   Ht 4' 11.5" (1.511 Haas)   Wt 93 lb (42.2 kg)   SpO2 98%   BMI 18.47 kg/Haas   BP Readings from Last 3 Encounters:  11/14/17 100/74 (15 %, Z = -1.02 /  85 %, Z = 1.05)*  11/08/17 104/60 (29 %, Z = -0.57 /  33 %, Z = -0.45)*  09/26/17 108/64 (44 %, Z = -0.16 /  48 %, Z = -0.05)*   *BP percentiles are based on the August 2017 AAP Clinical Practice Guideline for girls    Wt Readings from Last 3 Encounters:  11/14/17 93 lb (42.2 kg) (<1 %, Z= -2.48)*  11/08/17 93 lb 9.6 oz (42.5 kg) (<1 %, Z= -2.42)*  09/26/17 98 lb (44.5 kg) (2 %, Z= -1.96)*   * Growth percentiles are  based on CDC (Girls, 2-20 Years) data.    Physical Exam  Constitutional: She is oriented to person, place, and time. No distress.  HENT:  Right Ear: Tympanic membrane, external ear and ear canal normal.  Left Ear: Tympanic membrane, external ear and ear canal normal.  Nose: Mucosal edema and rhinorrhea present. Right sinus exhibits maxillary sinus tenderness and frontal sinus tenderness. Left sinus exhibits maxillary sinus tenderness and frontal sinus tenderness.  Mouth/Throat: Uvula is midline. No trismus in the jaw. Posterior oropharyngeal erythema present. No oropharyngeal exudate.  Neck: Normal range of motion. Neck supple.  Cardiovascular: Normal rate and regular rhythm.  Pulmonary/Chest: Effort normal and breath sounds normal.  Abdominal: Soft. Bowel sounds are normal. She exhibits no distension. There is no tenderness.  Lymphadenopathy:    She has cervical adenopathy.  Neurological: She is alert and oriented to person, place, and time.  Skin: Skin is warm and dry. No rash noted.  Psychiatric: She has a normal mood and affect. Her behavior is normal.  Vitals reviewed.   Lab Results  Component Value Date   WBC 8.3 03/15/2017   HGB 13.2 03/15/2017   HCT 40.0 03/15/2017   PLT 318 03/15/2017   GLUCOSE 98 03/15/2017   ALT 9 (L) 07/13/2015   AST 15 07/13/2015   NA 136  03/15/2017   K 4.1 03/15/2017   CL 104 03/15/2017   CREATININE 0.73 03/15/2017   BUN 11 03/15/2017   CO2 25 03/15/2017    Assessment & Plan:   Rexene AlbertsBridgett was seen today for diarrhea.  Diagnoses and all orders for this visit:  Pharyngitis with viral syndrome -     POCT rapid strep A -     POC Influenza A&B(BINAX/QUICKVUE) -     ondansetron (ZOFRAN) 4 MG tablet; Take 1 tablet (4 mg total) by mouth every 8 (eight) hours as needed for nausea or vomiting. -     fluticasone (FLONASE) 50 MCG/ACT nasal spray; Place 2 sprays into both nostrils daily. -     guaiFENesin-dextromethorphan (ROBITUSSIN DM) 100-10 MG/5ML  syrup; Take 5 mLs by mouth every 6 (six) hours as needed for cough.  Nausea -     ondansetron (ZOFRAN) 4 MG tablet; Take 1 tablet (4 mg total) by mouth every 8 (eight) hours as needed for nausea or vomiting. -     fluticasone (FLONASE) 50 MCG/ACT nasal spray; Place 2 sprays into both nostrils daily. -     guaiFENesin-dextromethorphan (ROBITUSSIN DM) 100-10 MG/5ML syrup; Take 5 mLs by mouth every 6 (six) hours as needed for cough.   I am having Mariah Haas start on ondansetron, fluticasone, and guaiFENesin-dextromethorphan. I am also having her maintain her SPRINTEC 28, fluticasone, and albuterol.  Meds ordered this encounter  Medications  . ondansetron (ZOFRAN) 4 MG tablet    Sig: Take 1 tablet (4 mg total) by mouth every 8 (eight) hours as needed for nausea or vomiting.    Dispense:  20 tablet    Refill:  0    Order Specific Question:   Supervising Provider    Answer:   Mariah Haas, Mariah Haas [[id number]]  . fluticasone (FLONASE) 50 MCG/ACT nasal spray    Sig: Place 2 sprays into both nostrils daily.    Dispense:  16 g    Refill:  0    Order Specific Question:   Supervising Provider    Answer:   Mariah Haas, Mariah Haas [[id number]]  . guaiFENesin-dextromethorphan (ROBITUSSIN DM) 100-10 MG/5ML syrup    Sig: Take 5 mLs by mouth every 6 (six) hours as needed for cough.    Dispense:  118 mL    Refill:  0    Order Specific Question:   Supervising Provider    Answer:   Mariah Haas, Mariah Haas [[id number]]    Follow-up: Return if symptoms worsen or fail to improve.  Alysia Pennaharlotte Kaleb Linquist, NP

## 2017-11-27 ENCOUNTER — Ambulatory Visit: Payer: Self-pay

## 2017-11-27 ENCOUNTER — Emergency Department (HOSPITAL_BASED_OUTPATIENT_CLINIC_OR_DEPARTMENT_OTHER)
Admission: EM | Admit: 2017-11-27 | Discharge: 2017-11-27 | Disposition: A | Discharge status: Home / Self Care | Payer: No Typology Code available for payment source | Attending: Emergency Medicine | Admitting: Emergency Medicine

## 2017-11-27 ENCOUNTER — Emergency Department (HOSPITAL_BASED_OUTPATIENT_CLINIC_OR_DEPARTMENT_OTHER): Payer: No Typology Code available for payment source

## 2017-11-27 ENCOUNTER — Encounter (HOSPITAL_BASED_OUTPATIENT_CLINIC_OR_DEPARTMENT_OTHER): Payer: Self-pay

## 2017-11-27 ENCOUNTER — Other Ambulatory Visit: Payer: Self-pay

## 2017-11-27 DIAGNOSIS — J45909 Unspecified asthma, uncomplicated: Secondary | ICD-10-CM | POA: Diagnosis not present

## 2017-11-27 DIAGNOSIS — R0789 Other chest pain: Secondary | ICD-10-CM | POA: Diagnosis not present

## 2017-11-27 DIAGNOSIS — Z79899 Other long term (current) drug therapy: Secondary | ICD-10-CM | POA: Insufficient documentation

## 2017-11-27 DIAGNOSIS — R079 Chest pain, unspecified: Secondary | ICD-10-CM | POA: Diagnosis present

## 2017-11-27 MED ORDER — ACETAMINOPHEN 325 MG PO TABS
650.0000 mg | ORAL_TABLET | Freq: Once | ORAL | Status: AC
  Administered 2017-11-27: 650 mg via ORAL
  Filled 2017-11-27: qty 2

## 2017-11-27 NOTE — Telephone Encounter (Signed)
Confirmed patient has arrived in the ER now. 

## 2017-11-27 NOTE — ED Provider Notes (Addendum)
[facility] EMERGENCY DEPARTMENT Provider Note   CSN: [id number] Arrival date & time: 11/27/17  1158     History   Chief Complaint Chief Complaint  Patient presents with  . Chest Pain    HPI Mariah Haas is a 22 y.o. female.  Complains of anterior chest pain nonradiating described as a tightness gradual onset 9 AM today symptoms not made better or worse by anything.  Pain is nonexertional.  No shortness of breath nausea or sweatiness.  No treatment prior to coming here.  No other associated limbs.  HPI  Past Medical History:  Diagnosis Date  . Allergy   . Asthma     Patient Active Problem List   Diagnosis Date Noted  . Allergic rhinitis 03/18/2017  . Migraine without aura and without status migrainosus, not intractable 01/28/2014    Past Surgical History:  Procedure Laterality Date  . NO PAST SURGERIES      OB History    No data available       Home Medications    Prior to Admission medications   Medication Sig Start Date End Date Taking? Authorizing Provider  albuterol (PROVENTIL HFA;VENTOLIN HFA) 108 (90 Base) MCG/ACT inhaler Inhale 1-2 puffs into the lungs every 6 (six) hours as needed for wheezing or shortness of breath. 11/08/17   Marcelyn BruinsPadgett, Shaylar Patricia, MD  fluticasone (FLONASE) 50 MCG/ACT nasal spray Place 2 sprays into both nostrils daily. 11/14/17   Nche, Bonna Gainsharlotte Lum, NP  fluticasone (FLOVENT HFA) 110 MCG/ACT inhaler Inhale 2 puffs into the lungs 2 (two) times daily. 11/08/17   Marcelyn BruinsPadgett, Shaylar Patricia, MD  SPRINTEC 28 0.25-35 MG-MCG tablet Take 1 tablet by mouth daily. 07/28/16   [provider]    Family History Family History  Problem Relation Age of Onset  . Seizures Mother   . Bipolar disorder Mother   . Anxiety disorder Mother   . Migraines Mother   . Headache Mother   . Diabetes Mother   . Lung cancer Maternal Grandmother   . Migraines Maternal Grandfather   . Bipolar disorder Maternal Grandfather   .  Seizures Maternal Aunt        Febrile Szs as an infant  . Migraines Maternal Aunt   . Bipolar disorder Maternal Aunt   . Anxiety disorder Maternal Aunt   . Cancer Maternal Aunt        lung  . Cancer Paternal Uncle        Astrocytoma  History negative for cardiac disease  Social History Social History   Tobacco Use  . Smoking status: Never Smoker  . Smokeless tobacco: Never Used  Substance Use Topics  . Alcohol use: No  . Drug use: No     Allergies   Blueberry [vaccinium angustifolium]   Review of Systems Review of Systems  Constitutional: Negative.   HENT: Negative.   Respiratory: Negative.   Cardiovascular: Positive for chest pain.  Gastrointestinal: Negative.   Musculoskeletal: Negative.   Skin: Negative.   Neurological: Negative.   Psychiatric/Behavioral: Negative.   All other systems reviewed and are negative.    Physical Exam Updated Vital Signs BP 111/74 (BP Location: Left Arm)   Pulse 69   Temp 98.3 F (36.8 C) (Oral)   Resp 16   LMP 11/26/2017   SpO2 100%   Physical Exam  Constitutional: She appears well-developed and well-nourished.  HENT:  Head: Normocephalic and atraumatic.  Eyes: Conjunctivae are normal. Pupils are equal, round, and reactive to light.  Neck:  Neck supple. No tracheal deviation present. No thyromegaly present.  Cardiovascular: Normal rate, regular rhythm, intact distal pulses and normal pulses.  No murmur heard. No rub  Pulmonary/Chest: Effort normal and breath sounds normal. She exhibits tenderness.  Chest tender over sternum.  Pain is exacerbated by forcible flexion of both shoulders.  Abdominal: Soft. Bowel sounds are normal. She exhibits no distension. There is no tenderness.  Musculoskeletal: Normal range of motion. She exhibits no edema or tenderness.  Neurological: She is alert. Coordination normal.  Skin: Skin is warm and dry. No rash noted.  Psychiatric: She has a normal mood and affect.  Nursing note and vitals  reviewed.    ED Treatments / Results  Labs (all labs ordered are listed, but only abnormal results are displayed) Labs Reviewed - No data to display  EKG  EKG Interpretation  Date/Time:  Wednesday November 27 2017 12:12:05 EST Ventricular Rate:  65 PR Interval:    QRS Duration: 83 QT Interval:  378 QTC Calculation: 393 R Axis:   63 Text Interpretation:  Sinus rhythm Borderline T abnormalities, anterior leads No significant change since last tracing Confirmed by Doug Sou [phone]) on 11/27/2017 12:16:15 PM       Radiology Dg Chest 2 View  Result Date: 11/27/2017 CLINICAL DATA:  Chest and back pain since 9 a.m. EXAM: CHEST  2 VIEW COMPARISON:  08/18/2017. FINDINGS: Trachea is midline. Heart size normal. Lungs are clear. No pleural fluid. 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 IMPRESSION: 1. No acute findings. 2. 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 Electronically Signed   By: Leanna Battles M.D.   On: 11/27/2017 12:46    Procedures Procedures (including critical care time)  Medications Ordered in ED Medications - No data to display Chest x-ray viewed by me  Initial Impression / Assessment and Plan / ED Course  I have reviewed the triage vital signs and the nursing notes.  Pertinent labs & imaging results that were available during my care of the patient were reviewed by me and considered in my medical decision making (see chart for details).     Is consistent with chest wall pain.  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  Plan Tylenol for pain. Follow-up PMD if not better in a few days Final Clinical Impressions(s) / ED Diagnoses  Diagnosis chest wall pain Final diagnoses:  None    ED Discharge Orders    None       Doug Sou, MD 11/27/17 1347    Doug Sou, MD 11/27/17 1348

## 2017-11-27 NOTE — Telephone Encounter (Signed)
Monitor for ER arrival 

## 2017-11-27 NOTE — Discharge Instructions (Signed)
Tylenol as directed for pain every 4 hours.  See your primary care physician if not feeling better in 4 or 5 days.  Return if concern for any reason

## 2017-11-27 NOTE — ED Triage Notes (Signed)
C/o CP since 9am-hx of childhood asthma-NAD-steady gait

## 2017-11-27 NOTE — Telephone Encounter (Signed)
Patient called in with c/o "chest pain, tightness." When asked to describe the pain, she says "it's at the top through my back and it feels like someone is sitting on my chest." She reports the "pain started when I woke up around 9am and my mom gave me my rescue inhaler." I asked did this help her pain, she said "no." She denies radiation to any other areas of her body, just going through to her back. I asked has she taken any medication for the pain, she said "no." According to the protocol, Call EMS 911, she stated "I will go when my mom comes out the store. I'm sitting in the car waiting on her now." Care advice given, patient verbalized understanding.   Reason for Disposition . [1] Chest pain lasts > 5 minutes AND [2] described as crushing, pressure-like, or heavy  Answer Assessment - Initial Assessment Questions 1. LOCATION: "Where does it hurt?"       Center-feels someone sitting on chest, took my rescue inhaler, did not help 2. RADIATION: "Does the pain go anywhere else?" (e.g., into neck, jaw, arms, back)     Back  3. ONSET: "When did the chest pain begin?" (Minutes, hours or days)      Around 9am 4. PATTERN "Does the pain come and go, or has it been constant since it started?"  "Does it get worse with exertion?"      Constant, not worse with exertion 5. DURATION: "How long does it last" (e.g., seconds, minutes, hours)     It's been constant since about 9a 6. SEVERITY: "How bad is the pain?"  (e.g., Scale 1-10; mild, moderate, or severe)    - MILD (1-3): doesn't interfere with normal activities     - MODERATE (4-7): interferes with normal activities or awakens from sleep    - SEVERE (8-10): excruciating pain, unable to do any normal activities       Moderate 7. CARDIAC RISK FACTORS: "Do you have any history of heart problems or risk factors for heart disease?" (e.g., prior heart attack, angina; high blood pressure, diabetes, being overweight, high cholesterol, smoking, or strong family  history of heart disease)     No 8. PULMONARY RISK FACTORS: "Do you have any history of lung disease?"  (e.g., blood clots in lung, asthma, emphysema, birth control pills)     Birth control pills, asthma 9. CAUSE: "What do you think is causing the chest pain?"     I don't know 10. OTHER SYMPTOMS: "Do you have any other symptoms?" (e.g., dizziness, nausea, vomiting, sweating, fever, difficulty breathing, cough)       Denies 11. PREGNANCY: "Is there any chance you are pregnant?" "When was your last menstrual period?"      No-LMP 11/26/17  Protocols used: CHEST PAIN-A-AH

## 2017-12-10 ENCOUNTER — Encounter: Payer: Self-pay | Admitting: *Deleted

## 2017-12-10 ENCOUNTER — Ambulatory Visit (INDEPENDENT_AMBULATORY_CARE_PROVIDER_SITE_OTHER): Payer: No Typology Code available for payment source | Admitting: Family Medicine

## 2017-12-10 ENCOUNTER — Encounter: Payer: Self-pay | Admitting: Family Medicine

## 2017-12-10 VITALS — BP 110/70 | HR 68 | Temp 99.5°F | Resp 12 | Ht 59.5 in | Wt 93.1 lb

## 2017-12-10 DIAGNOSIS — R519 Headache, unspecified: Secondary | ICD-10-CM

## 2017-12-10 DIAGNOSIS — R51 Headache: Secondary | ICD-10-CM | POA: Diagnosis not present

## 2017-12-10 DIAGNOSIS — R1033 Periumbilical pain: Secondary | ICD-10-CM

## 2017-12-10 DIAGNOSIS — R110 Nausea: Secondary | ICD-10-CM | POA: Diagnosis not present

## 2017-12-10 LAB — POCT URINALYSIS DIPSTICK
BILIRUBIN UA: NEGATIVE
Blood, UA: NEGATIVE
GLUCOSE UA: NEGATIVE
Ketones, UA: NEGATIVE
Leukocytes, UA: NEGATIVE
Nitrite, UA: NEGATIVE
ODOR: NEGATIVE
PH UA: 6 (ref 5.0–8.0)
Spec Grav, UA: 1.025 (ref 1.010–1.025)
Urobilinogen, UA: 0.2 E.U./dL

## 2017-12-10 LAB — POCT URINE PREGNANCY: Preg Test, Ur: NEGATIVE

## 2017-12-10 MED ORDER — ONDANSETRON HCL 4 MG PO TABS: 4.0000 mg | ORAL_TABLET | Freq: Three times a day (TID) | ORAL | 0 refills | Status: DC | PRN

## 2017-12-10 NOTE — Progress Notes (Signed)
ACUTE VISIT   HPI:  Chief Complaint  Patient presents with  . Headache    started on yesterday  . Abdominal Pain  . Nausea    [name] is a 22 y.o. female, who is here today complaining of a day of nausea and periumbilical abdominal pain According to patient, symptoms started yesterday, no prior history.  Moderate cramp pain, yesterday it was intermittently and today has been more constant.   Pain is not radiated. She has not identified exacerbating or alleviating factors.  She states that symptoms seem to be worse early in the morning and late at night.  No sick contact or recent travel. Has not try OTC medication.  She denies associated fever, chills, heartburn, vomiting, urinary symptoms, vaginal discharge, or vaginal bleeding. She is having formed stools but more often than usual, daily instead every 2-3 days. She has no noted mucus or blood in the stool.  Yesterday while she was driving she also had episodes of palpitation and sharp, bilateral chest pain.  This episode lasted for a few seconds. She also has had some mild body aches. No arthralgias or joint edema/erythema.   "Bad" frontal headache yesterday that lasted for 10-15 minutes. No associated visual changes, photophobia, focal deficit, or MS changes. Headache resolved spontaneously.   Review of Systems  Constitutional: Positive for appetite change and fatigue. Negative for activity change, fever and unexpected weight change.  HENT: Negative for mouth sores, sore throat and trouble swallowing.   Eyes: Negative for photophobia and visual disturbance.  Respiratory: Negative for cough, shortness of breath and wheezing.   Cardiovascular: Positive for palpitations. Negative for chest pain and leg swelling.  Gastrointestinal: Positive for abdominal pain and nausea. Negative for abdominal distention, blood in stool and vomiting.  Endocrine: Negative for cold intolerance and heat intolerance.    Genitourinary: Negative for dysuria, hematuria, pelvic pain, vaginal bleeding and vaginal discharge.  Musculoskeletal: Positive for myalgias. Negative for arthralgias and gait problem.  Skin: Negative for pallor and rash.  Allergic/Immunologic: Positive for environmental allergies. Negative for food allergies.  Neurological: Negative for syncope, facial asymmetry, weakness and numbness.  Hematological: Negative for adenopathy. Does not bruise/bleed easily.  Psychiatric/Behavioral: Negative for confusion. The patient is nervous/anxious.       Current Outpatient Medications on File Prior to Visit  Medication Sig Dispense Refill  . albuterol (PROVENTIL HFA;VENTOLIN HFA) 108 (90 Base) MCG/ACT inhaler Inhale 1-2 puffs into the lungs every 6 (six) hours as needed for wheezing or shortness of breath. 1 Inhaler 5  . fluticasone (FLONASE) 50 MCG/ACT nasal spray Place 2 sprays into both nostrils daily. 16 g 0  . fluticasone (FLOVENT HFA) 110 MCG/ACT inhaler Inhale 2 puffs into the lungs 2 (two) times daily. 1 Inhaler 5  . SPRINTEC 28 0.25-35 MG-MCG tablet Take 1 tablet by mouth daily.  2   No current facility-administered medications on file prior to visit.      Past Medical History:  Diagnosis Date  . Allergy   . Asthma    Allergies  Allergen Reactions  . Blueberry [Vaccinium Angustifolium]     Social History   Socioeconomic History  . Marital status: Single    Spouse name: None  . Number of children: None  . Years of education: None  . Highest education level: None  Social Needs  . Financial resource strain: None  . Food insecurity - worry: None  . Food insecurity - inability: None  . Transportation needs -  medical: None  . Transportation needs - non-medical: None  Occupational History  . None  Tobacco Use  . Smoking status: Never Smoker  . Smokeless tobacco: Never Used  Substance and Sexual Activity  . Alcohol use: No  . Drug use: No  . Sexual activity: None  Other  Topics Concern  . None  Social History Narrative  . None    Vitals:   12/10/17 1018  BP: 110/70  Pulse: 68  Resp: 12  Temp: 99.5 F (37.5 C)  SpO2: 99%   Body mass index is 18.49 kg/m.   Physical Exam  Nursing note and vitals reviewed. Constitutional: She is oriented to person, place, and time. She appears well-developed and well-nourished. She does not appear ill. No distress.  HENT:  Head: Normocephalic and atraumatic.  Mouth/Throat: Oropharynx is clear and moist and mucous membranes are normal.  Eyes: Conjunctivae and EOM are normal. Pupils are equal, round, and reactive to light. No scleral icterus.  Cardiovascular: Normal rate and regular rhythm.  No murmur heard. Respiratory: Effort normal and breath sounds normal. No respiratory distress. She exhibits no tenderness.  GI: Soft. Bowel sounds are normal. She exhibits no distension and no mass. There is no hepatomegaly. There is tenderness. There is no rebound, no guarding and no CVA tenderness.  Musculoskeletal: She exhibits no edema or tenderness.  Lymphadenopathy:    She has no cervical adenopathy.  Neurological: She is alert and oriented to person, place, and time. She has normal strength. Coordination and gait normal.  Skin: Skin is warm. No rash noted. No erythema.  Psychiatric: Her mood appears anxious.  Well groomed, poor eye contact.      ASSESSMENT AND PLAN:   Ms. Mariah Haas was seen today for headache, abdominal pain and nausea.  Diagnoses and all orders for this visit:  Periumbilical abdominal pain  We discussed possible etiologies. Examination today negative for acute abdomen, so for now I think we can hold on imaging. ? Beginning of viral illness (gastroenteritis.),dyspepsia among some.  Soft/liquid diet recommended. Monitor for new symptoms. Instructed about warning signs.  -     POCT urine pregnancy: Negative  Nausea without vomiting  Symptomatic treatment with Zofran recommended. Small  frequent sips of clear fluids. Instructed about warning signs.  -     POCT urine pregnancy -     ondansetron (ZOFRAN) 4 MG tablet; Take 1 tablet (4 mg total) by mouth every 8 (eight) hours as needed for nausea or vomiting.  Frontal headache  Improved. Possible causes discussed, she has history of migraine headaches as well as tension headaches. Instructed about warning signs.    -Ms.Amarionna N Haas was advised to seek immediate medical attention if sudden worsening symptoms or to follow if they persist or if new concerns arise.       Denece Shearer G. [place], MD  [facility]. [place] office.

## 2017-12-10 NOTE — Patient Instructions (Addendum)
A few things to remember from today's visit:   Generalized abdominal pain  Nausea without vomiting - Plan: POCT urine pregnancy, ondansetron (ZOFRAN) 4 MG tablet  Periumbilical abdominal pain - Plan: POCT urinalysis dipstick, POCT urine pregnancy  Follow a bland diet for the next few days, small meals, adequate hydration. Monitor for new symptoms.  Adequate hand hygiene.  Nausea, Adult Feeling sick to your stomach (nausea) means that your stomach is upset or you feel like you have to throw up (vomit). Feeling sick to your stomach is usually not serious, but it may be an early sign of a more serious medical problem. As you feel sicker to your stomach, it can lead to throwing up (vomiting). If you throw up, or if you are not able to drink enough fluids, there is a risk of dehydration. Dehydration can make you feel tired and thirsty, have a dry mouth, and pee (urinate) less often. Older adults and people who have other diseases or a weak defense (immune) system have a higher risk of dehydration. The main goal of treating this condition is to: Limit how often you feel sick to your stomach. Prevent throwing up and dehydration.  Follow these instructions at home: Follow instructions from your doctor about how to care for yourself at home. Eating and drinking Follow these recommendations as told by your doctor: Take an oral rehydration solution (ORS). This is a drink that is sold at pharmacies and stores. Drink clear fluids in small amounts as you are able, such as: Water. Ice chips. Fruit juice that has water added (diluted fruit juice). Low-calorie sports drinks. Eat bland, easy to digest foods in small amounts as you are able, such as: Bananas. Applesauce. Rice. Lean meats. Toast. Crackers. Avoid drinking fluids that contain a lot of sugar or caffeine. Avoid alcohol. Avoid spicy or fatty foods.  General instructions Drink enough fluid to keep your pee (urine) clear or pale  yellow. Wash your hands often. If you cannot use soap and water, use hand sanitizer. Make sure that all people in your household wash their hands well and often. Rest at home while you get better. Take over-the-counter and prescription medicines only as told by your doctor. Breathe slowly and deeply when you feel sick to your stomach. Watch your condition for any changes. Keep all follow-up visits as told by your doctor. This is important. Contact a doctor if: You have a headache. You have new symptoms. You feel sicker to your stomach. You have a fever. You feel light-headed or dizzy. You throw up. You are not able to keep fluids down. Get help right away if: You have pain in your chest, neck, arm, or jaw. You feel very weak or you pass out (faint). You have throw up that is bright red or looks like coffee grounds. You have bloody or black poop (stools), or poop that looks like tar. You have a very bad headache, a stiff neck, or both. You have very bad pain, cramping, or bloating in your belly. You have a rash. You have trouble breathing or you are breathing very quickly. Your heart is beating very quickly. Your skin feels cold and clammy. You feel confused. You have pain while peeing. You have signs of dehydration, such as: Dark pee, or very little or no pee. Cracked lips. Dry mouth. Sunken eyes. Sleepiness. Weakness. These symptoms may be an emergency. Do not wait to see if the symptoms will go away. Get medical help right away. Call your local emergency  services (911 in the [place] Do not drive yourself to the hospital. This information is not intended to replace advice given to you by your health care provider. Make sure you discuss any questions you have with your health care provider. Document Released: 10/25/2011 Document Revised: 04/12/2016 Document Reviewed: 07/12/2015 Elsevier Interactive Patient Education  2018 Elsevier Inc.   GET HELP RIGHT AWAY IF:   The pain  is does not go away within 2 hours.  Sudden severe/worsening pain.  You keep throwing up (vomiting).  The pain changes and is only in the right or left part of the belly.  Not being able to pass gas or poop.  You have bloody or tarry looking poop.   MAKE SURE YOU:   Understand these instructions.  Will watch your condition.  Will get help right away if you are not doing well or get worse.   If symptoms are persistent please arrange a follow up appointment.     Please be sure medication list is accurate. If a new problem present, please set up appointment sooner than planned today.

## 2017-12-16 ENCOUNTER — Encounter: Payer: Self-pay | Admitting: Family Medicine

## 2017-12-16 ENCOUNTER — Ambulatory Visit: Payer: No Typology Code available for payment source | Admitting: Family Medicine

## 2017-12-16 ENCOUNTER — Ambulatory Visit (INDEPENDENT_AMBULATORY_CARE_PROVIDER_SITE_OTHER): Payer: No Typology Code available for payment source | Admitting: Family Medicine

## 2017-12-16 VITALS — BP 110/78 | HR 72 | Temp 99.0°F | Resp 12 | Ht 59.5 in | Wt 92.8 lb

## 2017-12-16 DIAGNOSIS — R636 Underweight: Secondary | ICD-10-CM

## 2017-12-16 DIAGNOSIS — K5900 Constipation, unspecified: Secondary | ICD-10-CM | POA: Diagnosis not present

## 2017-12-16 DIAGNOSIS — G43009 Migraine without aura, not intractable, without status migrainosus: Secondary | ICD-10-CM | POA: Diagnosis not present

## 2017-12-16 DIAGNOSIS — R1084 Generalized abdominal pain: Secondary | ICD-10-CM

## 2017-12-16 DIAGNOSIS — F419 Anxiety disorder, unspecified: Secondary | ICD-10-CM | POA: Diagnosis not present

## 2017-12-16 DIAGNOSIS — N83209 Unspecified ovarian cyst, unspecified side: Secondary | ICD-10-CM | POA: Insufficient documentation

## 2017-12-16 LAB — POCT URINE PREGNANCY: Preg Test, Ur: NEGATIVE

## 2017-12-16 NOTE — Progress Notes (Signed)
HPI:  Chief Complaint  Patient presents with  . Abdominal Cramping    sx started a week ago  . Back Pain    lower back started last night  . Chest Pain    Sx started a week ago  . Weight Loss    [name] is a 22 y.o. female, who is here today for 6 months follow up on migraine.   She is not longer on Amitriptyline,discontiued a few weeks ago because headaches resolved.   She has several complaints today.  She is concerned about wt loss. She is not sure how much wt she has lost.  She states that she eats "a lot" and she doe snot gain wt, her mother is also concerned. Her mother is on the phone and she is requesting blood work to "check for leukemia."   LMP 11/26/2017. Menses are not heavy. She is on OCP's.   Bloating sensation "a lot." Intermittent lower back pain since last night, it is not radiated. Mild,no limitation of ROM.  No associated saddle anesthesia,urine/bowel incontinence,or LE numbness. Not sure about exacerbating or alleviating factors.  Constipation,"bad cramps" and lower back pain for  Last bowel movement today, hard, it was "ok." Cramping,diffuse abdominal pain is more at night. Exacerbated by food intake. She has had nausea but improving. No changes in abdominal pain after defecation.  Denies dysuria,increased urinary frequency, gross hematuria,or decreased urine output.  Chest pain ,"hurting", usually when she is having abdominal pain and after food intake. Not sure about alleviating factors. It does "not last long." She lies down and pain resolves in a few minutes.  No associated dyspnea,palpitations,or diaphoresis. Denies chest pain with exertion.    Review of Systems  Constitutional: Positive for unexpected weight change. Negative for activity change, appetite change, fatigue and fever.  HENT: Negative for mouth sores, sore throat and trouble swallowing.   Respiratory: Negative for cough, shortness of breath and  wheezing.   Cardiovascular: Negative for palpitations and leg swelling.  Gastrointestinal: Positive for abdominal pain, constipation and nausea. Negative for abdominal distention, blood in stool and vomiting.  Endocrine: Negative for cold intolerance, heat intolerance, polydipsia, polyphagia and polyuria.  Genitourinary: Negative for dysuria, frequency, hematuria, menstrual problem, vaginal bleeding and vaginal discharge.  Musculoskeletal: Positive for back pain. Negative for arthralgias, gait problem, joint swelling and neck pain.  Skin: Negative for pallor and rash.  Allergic/Immunologic: Positive for environmental allergies.  Neurological: Negative for syncope, weakness, numbness and headaches.  Hematological: Negative for adenopathy. Does not bruise/bleed easily.  Psychiatric/Behavioral: Negative for confusion. The patient is nervous/anxious.       Current Outpatient Medications on File Prior to Visit  Medication Sig Dispense Refill  . albuterol (PROVENTIL HFA;VENTOLIN HFA) 108 (90 Base) MCG/ACT inhaler Inhale 1-2 puffs into the lungs every 6 (six) hours as needed for wheezing or shortness of breath. 1 Inhaler 5  . fluticasone (FLONASE) 50 MCG/ACT nasal spray Place 2 sprays into both nostrils daily. 16 g 0  . fluticasone (FLOVENT HFA) 110 MCG/ACT inhaler Inhale 2 puffs into the lungs 2 (two) times daily. 1 Inhaler 5  . SPRINTEC 28 0.25-35 MG-MCG tablet Take 1 tablet by mouth daily.  2   No current facility-administered medications on file prior to visit.      Past Medical History:  Diagnosis Date  . Allergy   . Asthma    Allergies  Allergen Reactions  . Blueberry [Vaccinium Angustifolium]     Social History  Socioeconomic History  . Marital status: Single    Spouse name: None  . Number of children: None  . Years of education: None  . Highest education level: None  Social Needs  . Financial resource strain: None  . Food insecurity - worry: None  . Food insecurity -  inability: None  . Transportation needs - medical: None  . Transportation needs - non-medical: None  Occupational History  . None  Tobacco Use  . Smoking status: Never Smoker  . Smokeless tobacco: Never Used  Substance and Sexual Activity  . Alcohol use: No  . Drug use: No  . Sexual activity: None  Other Topics Concern  . None  Social History Narrative  . None    Vitals:   12/16/17 1533  BP: 110/78  Pulse: 72  Resp: 12  Temp: 99 F (37.2 C)  SpO2: 99%   Body mass index is 18.43 kg/m.  Wt Readings from Last 3 Encounters:  12/16/17 92 lb 12.8 oz (42.1 kg) (<1 %, Z= -2.51)*  12/10/17 93 lb 2 oz (42.2 kg) (<1 %, Z= -2.47)*  11/14/17 93 lb (42.2 kg) (<1 %, Z= -2.48)*   * Growth percentiles are based on CDC (Girls, 2-20 Years) data.     Physical Exam  Nursing note and vitals reviewed. Constitutional: She is oriented to person, place, and time. She appears well-developed. She does not appear ill. No distress.  HENT:  Head: Normocephalic and atraumatic.  Mouth/Throat: Oropharynx is clear and moist and mucous membranes are normal.  Eyes: Conjunctivae and EOM are normal. Pupils are equal, round, and reactive to light. No scleral icterus.  Neck: No tracheal deviation present. No thyroid mass and no thyromegaly present.  Cardiovascular: Normal rate and regular rhythm.  No murmur heard. Respiratory: Effort normal and breath sounds normal. No respiratory distress.  GI: Soft. Bowel sounds are normal. She exhibits no distension and no mass. There is no hepatomegaly. There is no tenderness.  Musculoskeletal: She exhibits no edema or tenderness.  Lymphadenopathy:    She has no cervical adenopathy.       Right: No supraclavicular adenopathy present.       Left: No supraclavicular adenopathy present.  Neurological: She is alert and oriented to person, place, and time. She has normal strength. Coordination and gait normal.  Skin: Skin is warm. No rash noted. No erythema.    Psychiatric: Her mood appears anxious.  Well groomed, good eye contact.    ASSESSMENT AND PLAN:   Ms. Mariah Haas was seen today for 6 months follow-up and to address other concerns: Abdominal cramping, back pain, chest pain and weight loss.  Diagnoses and all orders for this visit:  Lab Results  Component Value Date   TSH 1.02 12/16/2017   Lab Results  Component Value Date   WBC 5.9 12/16/2017   HGB 13.3 12/16/2017   HCT 40.7 12/16/2017   MCV 86.5 12/16/2017   PLT 288.0 12/16/2017   Lab Results  Component Value Date   CREATININE 0.72 12/16/2017   BUN 7 12/16/2017   NA 136 12/16/2017   K 4.0 12/16/2017   CL 102 12/16/2017   CO2 25 12/16/2017    Generalized abdominal pain  Possible etiologies discussed, ? IBS. Examination today negative otherwise. Further recommendations will be given according to lab results. I do not think imaging is needed at this time.  -     Basic metabolic panel -     CBC -     POCT  urine pregnancy  Constipation, unspecified constipation type  Increased fluid and fiber intake. This could be contributing to her abdominal pain. Instructed about warning signs.  -     Basic metabolic panel -     TSH  Underweight  Wt has been stable. Recommend weighting herself at home to monitor wt more accurately.  -     TSH  Migraine without aura and without status migrainosus, not intractable  Resolved. She is not longer on Amitriptyline. F/U as needed.   Anxiety disorder, unspecified type  Multiple complaints and examination otherwise normal. We may consider SSRI if work up is negative and symptoms are persistent. I will see her back in 2 months.     -Ms. Mariah Haas was advised to return sooner than planned today if new concerns arise.       Betty G. [place], MD  [facility] [place] office.

## 2017-12-16 NOTE — Patient Instructions (Addendum)
A few things to remember from today's visit:   Generalized abdominal pain - Plan: Basic metabolic panel, TSH, CBC, POCT urine pregnancy  Constipation, unspecified constipation type  Underweight  Migraine without aura and without status migrainosus, not intractable  Daily weight at home.  We have ordered labs or studies at this visit.  It can take up to 1-2 weeks for results and processing. IF results require follow up or explanation, we will call you with instructions. Clinically stable results will be released to your [facility]. If you have not heard from us or cannot find your results in [facility] in 2 weeks please contact our office at [phone].  If you are not yet signed up for [facility], please consider signing up  Miralax or Colace over the counter. Fiber and fluid intake.   Constipation, Adult Constipation is when a person:  Poops (has a bowel movement) fewer times in a week than normal.  Has a hard time pooping.  Has poop that is dry, hard, or bigger than normal.  Follow these instructions at home: Eating and drinking   Eat foods that have a lot of fiber, such as: ? Fresh fruits and vegetables. ? Whole grains. ? Beans.  Eat less of foods that are high in fat, low in fiber, or overly processed, such as: ? [place] fries. ? Hamburgers. ? Cookies. ? Candy. ? Soda.  Drink enough fluid to keep your pee (urine) clear or pale yellow. General instructions  Exercise regularly or as told by your doctor.  Go to the restroom when you feel like you need to poop. Do not hold it in.  Take over-the-counter and prescription medicines only as told by your doctor. These include any fiber supplements.  Do pelvic floor retraining exercises, such as: ? Doing deep breathing while relaxing your lower belly (abdomen). ? Relaxing your pelvic floor while pooping.  Watch your condition for any changes.  Keep all follow-up visits as told by your doctor. This is  important. Contact a doctor if:  You have pain that gets worse.  You have a fever.  You have not pooped for 4 days.  You throw up (vomit).  You are not hungry.  You lose weight.  You are bleeding from the anus.  You have thin, pencil-like poop (stool). Get help right away if:  You have a fever, and your symptoms suddenly get worse.  You leak poop or have blood in your poop.  Your belly feels hard or bigger than normal (is bloated).  You have very bad belly pain.  You feel dizzy or you faint. This information is not intended to replace advice given to you by your health care provider. Make sure you discuss any questions you have with your health care provider. Document Released: 04/23/2008 Document Revised: 05/25/2016 Document Reviewed: 04/25/2016 Elsevier Interactive Patient Education  2018 [employer]   Please be sure medication list is accurate. If a new problem present, please set up appointment sooner than planned today.

## 2017-12-17 ENCOUNTER — Encounter: Payer: Self-pay | Admitting: Family Medicine

## 2017-12-17 LAB — BASIC METABOLIC PANEL
BUN: 7 mg/dL (ref 6–23)
CO2: 25 mEq/L (ref 19–32)
Calcium: 9.1 mg/dL (ref 8.4–10.5)
Chloride: 102 mEq/L (ref 96–112)
Creatinine, Ser: 0.72 mg/dL (ref 0.40–1.20)
GFR: 110.44 mL/min (ref >60.00)
GLUCOSE: 75 mg/dL (ref 70–99)
POTASSIUM: 4 meq/L (ref 3.5–5.1)
SODIUM: 136 meq/L (ref 135–145)

## 2017-12-17 LAB — CBC
HCT: 40.7 % (ref 36.0–49.0)
Hemoglobin: 13.3 g/dL (ref 12.0–16.0)
MCHC: 32.8 g/dL (ref 31.0–37.0)
MCV: 86.5 fl (ref 78.0–98.0)
PLATELETS: 288 10*3/uL (ref 150.0–575.0)
RBC: 4.7 Mil/uL (ref 3.80–5.70)
RDW: 13.2 % (ref 11.4–15.5)
WBC: 5.9 10*3/uL (ref 4.5–13.5)

## 2017-12-17 LAB — TSH: TSH: 1.02 u[IU]/mL (ref 0.40–5.00)

## 2017-12-25 ENCOUNTER — Telehealth: Payer: Self-pay | Admitting: Allergy

## 2017-12-25 NOTE — Telephone Encounter (Signed)
Please advise 

## 2017-12-25 NOTE — Telephone Encounter (Signed)
Pt mom  Called and said the Flovent inhale is too high and can not pay for it. Wanted to see if we have samples or something else because their ins does not cover meds. [phone].

## 2017-12-26 NOTE — Telephone Encounter (Signed)
Called [facility] and spoke to pharmacist.  Patient does not have any insurance on file to cover any medication.

## 2017-12-26 NOTE — Telephone Encounter (Signed)
Called and spoke to pts mom and mom confirmed that insurance only covers medical visits and does not cover medications.  Discussed with Dr. Delorse LekPadgett and patient will be switched from Flovent to Asmanex HFA 220 mcg 2 inhalations twice daily.  Two samples of Asmanex 200 mcg have been provided for patient to pick up from office.  Good RX card provided for patient too.  Mom confirmed that [facility] did have Ventolin inhaler.  Ventolin cost $80.

## 2017-12-26 NOTE — Telephone Encounter (Signed)
Her insurance covers no meds?  It looks like she has [employer] can you please call pharmacy and see what is covered per her plan.

## 2017-12-29 ENCOUNTER — Emergency Department (HOSPITAL_BASED_OUTPATIENT_CLINIC_OR_DEPARTMENT_OTHER)
Admission: EM | Admit: 2017-12-29 | Discharge: 2017-12-29 | Disposition: A | Discharge status: Home / Self Care | Payer: No Typology Code available for payment source | Attending: Emergency Medicine | Admitting: Emergency Medicine

## 2017-12-29 ENCOUNTER — Other Ambulatory Visit: Payer: Self-pay

## 2017-12-29 ENCOUNTER — Encounter (HOSPITAL_BASED_OUTPATIENT_CLINIC_OR_DEPARTMENT_OTHER): Payer: Self-pay | Admitting: *Deleted

## 2017-12-29 DIAGNOSIS — Z79899 Other long term (current) drug therapy: Secondary | ICD-10-CM | POA: Insufficient documentation

## 2017-12-29 DIAGNOSIS — R69 Illness, unspecified: Secondary | ICD-10-CM

## 2017-12-29 DIAGNOSIS — J029 Acute pharyngitis, unspecified: Secondary | ICD-10-CM | POA: Diagnosis present

## 2017-12-29 DIAGNOSIS — J45909 Unspecified asthma, uncomplicated: Secondary | ICD-10-CM | POA: Insufficient documentation

## 2017-12-29 DIAGNOSIS — J111 Influenza due to unidentified influenza virus with other respiratory manifestations: Secondary | ICD-10-CM | POA: Diagnosis not present

## 2017-12-29 LAB — RAPID STREP SCREEN (MED CTR MEBANE ONLY): STREPTOCOCCUS, GROUP A SCREEN (DIRECT): NEGATIVE

## 2017-12-29 NOTE — ED Notes (Signed)
Alert, NAD, calm, interactive, resps e/u, speaking in clear complete sentences, no dyspnea noted, skin W&D, VSS, c/o ear pain, sore throat, cough, congestion.  [facility]

## 2017-12-29 NOTE — ED Provider Notes (Addendum)
[facility] EMERGENCY DEPARTMENT Provider Note   CSN: [id number] Arrival date & time: 12/29/17  0636     [id number]   Chief Complaint Chief Complaint  Patient presents with  . Sore Throat    HPI Mariah Haas is a 22 y.o. female.  Complains of cough, sore throat, bilateral ear pain, nasal congestion and diffuse myalgias onset 3 days ago.  Treated with Delsym, without relief.  No other associated symptoms.  No shortness of breath.  No fever.  Nothing makes symptoms better or worse.  HPI  Past Medical History:  Diagnosis Date  . Allergy   . Asthma     Patient Active Problem List   Diagnosis Date Noted  . Cyst of ovary 12/16/2017  . Allergic rhinitis 03/18/2017  . Migraine without aura and without status migrainosus, not intractable 01/28/2014    Past Surgical History:  Procedure Laterality Date  . NO PAST SURGERIES      OB History    No data available       Home Medications    Prior to Admission medications   Medication Sig Start Date End Date Taking? Authorizing Provider  albuterol (PROVENTIL HFA;VENTOLIN HFA) 108 (90 Base) MCG/ACT inhaler Inhale 1-2 puffs into the lungs every 6 (six) hours as needed for wheezing or shortness of breath. 11/08/17   Marcelyn BruinsPadgett, Shaylar Patricia, MD  fluticasone (FLONASE) 50 MCG/ACT nasal spray Place 2 sprays into both nostrils daily. 11/14/17   Nche, Bonna Gainsharlotte Lum, NP  fluticasone (FLOVENT HFA) 110 MCG/ACT inhaler Inhale 2 puffs into the lungs 2 (two) times daily. 11/08/17   Marcelyn BruinsPadgett, Shaylar Patricia, MD  SPRINTEC 28 0.25-35 MG-MCG tablet Take 1 tablet by mouth daily. 07/28/16   [provider]    Family History Family History  Problem Relation Age of Onset  . Seizures Mother   . Bipolar disorder Mother   . Anxiety disorder Mother   . Migraines Mother   . Headache Mother   . Diabetes Mother   . Lung cancer Maternal Grandmother   . Migraines Maternal Grandfather   . Bipolar disorder Maternal Grandfather     . Seizures Maternal Aunt        Febrile Szs as an infant  . Migraines Maternal Aunt   . Bipolar disorder Maternal Aunt   . Anxiety disorder Maternal Aunt   . Cancer Maternal Aunt        lung  . Cancer Paternal Uncle        Astrocytoma    Social History Social History   Tobacco Use  . Smoking status: Never Smoker  . Smokeless tobacco: Never Used  Substance Use Topics  . Alcohol use: No  . Drug use: No     Allergies   Blueberry [vaccinium angustifolium]   Review of Systems Review of Systems  Constitutional: Negative.   HENT: Positive for congestion, ear pain and sore throat.   Respiratory: Positive for cough.   Cardiovascular: Negative.   Gastrointestinal: Negative.   Musculoskeletal: Positive for myalgias.  Skin: Negative.   Neurological: Negative.   Psychiatric/Behavioral: Negative.   All other systems reviewed and are negative.    Physical Exam Updated Vital Signs BP 111/68 (BP Location: Right Arm)   Pulse 72   Temp 98.2 F (36.8 C) (Oral)   Resp 16   Ht 5' (1.524 m)   Wt 41.7 kg (92 lb)   LMP 12/25/2017 (Exact Date)   SpO2 100%   BMI 17.97 kg/m   Physical Exam  Constitutional:  She is oriented to person, place, and time. She appears well-developed and well-nourished.  HENT:  Head: Normocephalic and atraumatic.  Right Ear: External ear normal.  Left Ear: External ear normal.  Bilateral tympanic membranes normal.  Oropharynx mildly reddened.  No exudate.  Uvula midline.  Nasal congestion present  Eyes: Conjunctivae are normal. Pupils are equal, round, and reactive to light.  Neck: Neck supple. No tracheal deviation present. No thyromegaly present.  Shotty anterior cervical lymphadenopathy  Cardiovascular: Normal rate and regular rhythm.  No murmur heard. Pulmonary/Chest: Effort normal and breath sounds normal.  Abdominal: Soft. Bowel sounds are normal. She exhibits no distension. There is no tenderness.  Musculoskeletal: Normal range of motion.  She exhibits no edema or tenderness.  Neurological: She is alert and oriented to person, place, and time. Coordination normal.  Skin: Skin is warm and dry. No rash noted. No erythema.  Psychiatric: She has a normal mood and affect.  Nursing note and vitals reviewed.    ED Treatments / Results  Labs (all labs ordered are listed, but only abnormal results are displayed) Labs Reviewed  RAPID STREP SCREEN (NOT AT [facility])  CULTURE, GROUP A STREP [facility])    EKG  EKG Interpretation None      Results for orders placed or performed during the hospital encounter of 12/29/17  Rapid strep screen  Result Value Ref Range   Streptococcus, Group A Screen (Direct) NEGATIVE NEGATIVE   No results found. Radiology No results found.  Procedures Procedures (including critical care time)  Medications Ordered in ED Medications - No data to display   Initial Impression / Assessment and Plan / ED Course  I have reviewed the triage vital signs and the nursing notes.  Pertinent labs & imaging results that were available during my care of the patient were reviewed by me and considered in my medical decision making (see chart for details).   Pharyngeal culture pending  Symptoms consistent with influenza-like illness. Plan Tylenol or Advil for discomfort.  Follow-up with PMD if not improved in 3 days.  Encourage oral hydration Final Clinical Impressions(s) / ED Diagnoses   Final diagnoses:  Influenza-like illness    ED Discharge Orders    None       Doug Sou, MD 12/29/17 1610    Doug Sou, MD 12/29/17 [phone]

## 2017-12-29 NOTE — Discharge Instructions (Signed)
Take Tylenol or Advil as directed for aches.Make sure that you drink at least six 8 ounce glasses of water or Gatorade each day in order to stay well-hydrated.  See Dr. SwazilandJordan if you do not feel better by 01/01/2018.  Return if your condition worsens for any reason

## 2017-12-29 NOTE — ED Triage Notes (Signed)
C/o sore throat x3d, also reports ear pain, L >R, describes as ears burning, also HA, runny nose, cough with yellow mucus. (Denies: NVD or fever).

## 2017-12-31 LAB — CULTURE, GROUP A STREP (THRC)

## 2018-02-06 ENCOUNTER — Encounter: Payer: Self-pay | Admitting: Family Medicine

## 2018-02-06 ENCOUNTER — Ambulatory Visit (INDEPENDENT_AMBULATORY_CARE_PROVIDER_SITE_OTHER): Payer: No Typology Code available for payment source | Admitting: Family Medicine

## 2018-02-06 VITALS — BP 110/68 | HR 67 | Resp 19

## 2018-02-06 DIAGNOSIS — J3089 Other allergic rhinitis: Secondary | ICD-10-CM | POA: Diagnosis not present

## 2018-02-06 DIAGNOSIS — J302 Other seasonal allergic rhinitis: Secondary | ICD-10-CM

## 2018-02-06 DIAGNOSIS — J4540 Moderate persistent asthma, uncomplicated: Secondary | ICD-10-CM

## 2018-02-06 DIAGNOSIS — J4520 Mild intermittent asthma, uncomplicated: Secondary | ICD-10-CM | POA: Insufficient documentation

## 2018-02-06 MED ORDER — MOMETASONE FUROATE 220 MCG/INH IN AEPB: 2.0000 | INHALATION_SPRAY | Freq: Two times a day (BID) | RESPIRATORY_TRACT | 5 refills | Status: DC

## 2018-02-06 NOTE — Progress Notes (Signed)
[address] [place] [address] Dept: [phone]  FOLLOW UP NOTE  Patient ID: Mariah SchimkeBridgett N Dafoe, female    DOB: 09/22/1996  Age: 22 y.o. MRN: [id number] Date of Office Visit: 02/06/2018  Assessment  Chief Complaint: Asthma  HPI Mariah Haas is a 22 year old female who presents for a follow up visit. She is accompanied by her mother who assists with history. She was last seen in this office on 11/08/2017 for evaluation of allergic rhinitis, asthma, and oral allergy syndrome. At that visit, her skin testing was positive for grass, weed, tree, mold, dust mite, cat, dog, mixed feathers, and tobacco leaf. She was started on cetirizine 10 mg and nasal steroid spray as needed. For her asthma, she was started on montelukast and Flovent 110- 2 puffs twice a day.   In the interim, she reports she was not able to afford the Flovent inhaler and Asmanex 200 sample was supplied from this office. It is unclear if she picked this up from the office. She began using both ProAir and Proventil HFA inhalers at that time due to the fact that she thought that one was a controller and one was a rescue inhaler.  At today's visit, she reports she has done well overall.  Lainey's asthma has been well controlled. She has not required rescue medication, experienced nocturnal awakenings due to lower respiratory symptoms, nor have activities of daily living been limited. She has required no Emergency Department or Urgent Care visits for her asthma. She has required zero courses of systemic steroids for asthma exacerbations since the last visit. ACT score today is 25, indicating excellent asthma symptom control. She is currently using the albuterol inhalers about 3-4 times a week with relief of symptoms. She is not currently taking montelukast 10 mg.   Allergic rhinitis is reported as well controlled with the use of Flonase nasal spray on an as needed basis.   Due to oral allergy syndrome, she  continues to avoid many fresh fruits and vegetables including specifically blueberries.  Her current medications are listed in the chart.  Drug Allergies:  Allergies  Allergen Reactions  . Blueberry [Vaccinium Angustifolium]     Physical Exam: BP 110/68 (BP Location: Left Arm, Patient Position: Sitting)   Pulse 67   Resp 19   SpO2 93%    Physical Exam  Constitutional: She is oriented to person, place, and time. She appears well-developed and well-nourished.  HENT:  Head: Normocephalic and atraumatic.  Right Ear: External ear normal.  Left Ear: External ear normal.  Bilateral nares slightly erythematous and edematous with clear nasal drainage noted. Pharynx slightly erythematous with no exudate noted. Ears normal. Eyes normal.  Eyes: Conjunctivae are normal.  Neck: Normal range of motion. Neck supple.  Cardiovascular: Normal rate, regular rhythm and normal heart sounds.  No murmur noted.  Pulmonary/Chest: Effort normal and breath sounds normal.  Lungs clear to auscultation.  Musculoskeletal: Normal range of motion.  Neurological: She is alert and oriented to person, place, and time.  Skin: Skin is warm and dry.  Psychiatric: She has a normal mood and affect. Her behavior is normal.    Diagnostics: FVC 2.92, FEV1 2.56. Predicted FVC 3.01, predicted FEV1 2.71. Spirometry reveals normal ventilatory function.  Assessment and Plan: 1. Moderate persistent asthma without complication   2. Seasonal and perennial allergic rhinitis     Meds ordered this encounter  Medications  . mometasone (ASMANEX 120 METERED DOSES) 220 MCG/INH inhaler    Sig:  Inhale 2 puffs into the lungs 2 (two) times daily.    Dispense:  1 Inhaler    Refill:  5    Patient Instructions  Asthma    - have access to albuterol inhaler 2 puffs every 4 hours as needed for cough/wheeze/shortness of breath/chest tightness.  May use 15-20 minutes prior to activity.   Monitor frequency of use.       - continue  singulair for now     - start Asmanex 200 mcg 2 puffs twice a day with spacer. You may decrease to 2 puffs once a day after 2 weeks or when your breathing is at baseline and you are using your albuterol inhaler less than 2 days a week. We will call in a similar affordable inhaler to your pharmacy    - Asthma control goals:   Full participation in all desired activities (may need albuterol before activity)  Albuterol use two time or less a week on average (not counting use with activity)  Cough interfering with sleep two time or less a month  Oral steroids no more than once a year  No hospitalizations  Allergies    - At your last visit your allergy testing was positive to grasses, weeds, trees, molds, dust mite, cat, dog, mixed feathers, tobacco leaf.   - Continue allergen avoidance measures discussed today and provided with handouts    - recommend use of long-acting antihistamine like Zyrtec 10mg , Xyzal 5mg  or Allegra 180mg  daily for control of allergy symptoms (nasal congestion/drainage, sneezing, itch, eye symptoms)    - for better control of nasal congestion/drainage recommend use of nasal steroid spray like Flonase, Rhinocort or Nasocort 1-2 sprays each nostril daily as needed.  Use for 1-2 weeks at a time before stopping once symptoms improve.     - as above continue singulair for now.    Pollen food allergy syndrome   - you have pollen sensitivity which can cause oral symptoms to certain foods (like fresh fruits or vegatables).    - continue to avoid fresh blueberries  Follow-up 3 months or sooner if needed   Return in about 3 months (around 05/09/2018), or if symptoms worsen or fail to improve.    Thank you for the opportunity to care for this patient.  Please do not hesitate to contact me with questions.  Thermon Leyland, FNP [facility]

## 2018-02-06 NOTE — Patient Instructions (Addendum)
Asthma    - have access to albuterol inhaler 2 puffs every 4 hours as needed for cough/wheeze/shortness of breath/chest tightness.  May use 15-20 minutes prior to activity.   Monitor frequency of use.       - continue singulair for now     - start Asmanex 200 mcg 2 puffs twice a day with spacer. You may decrease to 2 puffs once a day after 2 weeks or when your breathing is at baseline and you are using your albuterol inhaler less than 2 days a week. We will call in a similar affordable inhaler to your pharmacy    - Asthma control goals:   Full participation in all desired activities (may need albuterol before activity)  Albuterol use two time or less a week on average (not counting use with activity)  Cough interfering with sleep two time or less a month  Oral steroids no more than once a year  No hospitalizations  Allergies    - At your last visit your allergy testing was positive to grasses, weeds, trees, molds, dust mite, cat, dog, mixed feathers, tobacco leaf.   - Continue allergen avoidance measures discussed today and provided with handouts    - recommend use of long-acting antihistamine like Zyrtec 10mg , Xyzal 5mg  or Allegra 180mg  daily for control of allergy symptoms (nasal congestion/drainage, sneezing, itch, eye symptoms)    - for better control of nasal congestion/drainage recommend use of nasal steroid spray like Flonase, Rhinocort or Nasocort 1-2 sprays each nostril daily as needed.  Use for 1-2 weeks at a time before stopping once symptoms improve.     - as above continue singulair for now.    Pollen food allergy syndrome   - you have pollen sensitivity which can cause oral symptoms to certain foods (like fresh fruits or vegatables).    - continue to avoid fresh blueberries  Follow-up 3 months or sooner if needed

## 2018-02-18 IMAGING — CR DG LUMBAR SPINE COMPLETE 4+V
5 series · 5 of 5 positions shown · non-contrast
Comparison: Bone window images from CT abdomen and pelvis
07/13/2015.

CLINICAL DATA: 20-year-old who fell approximately 5 feet while
cheerleading earlier tonight, landing on the right side. Right-sided
low back pain currently. Initial encounter.

EXAM:
LUMBAR SPINE - COMPLETE 4+ VIEW

[t lumbar spine ap]
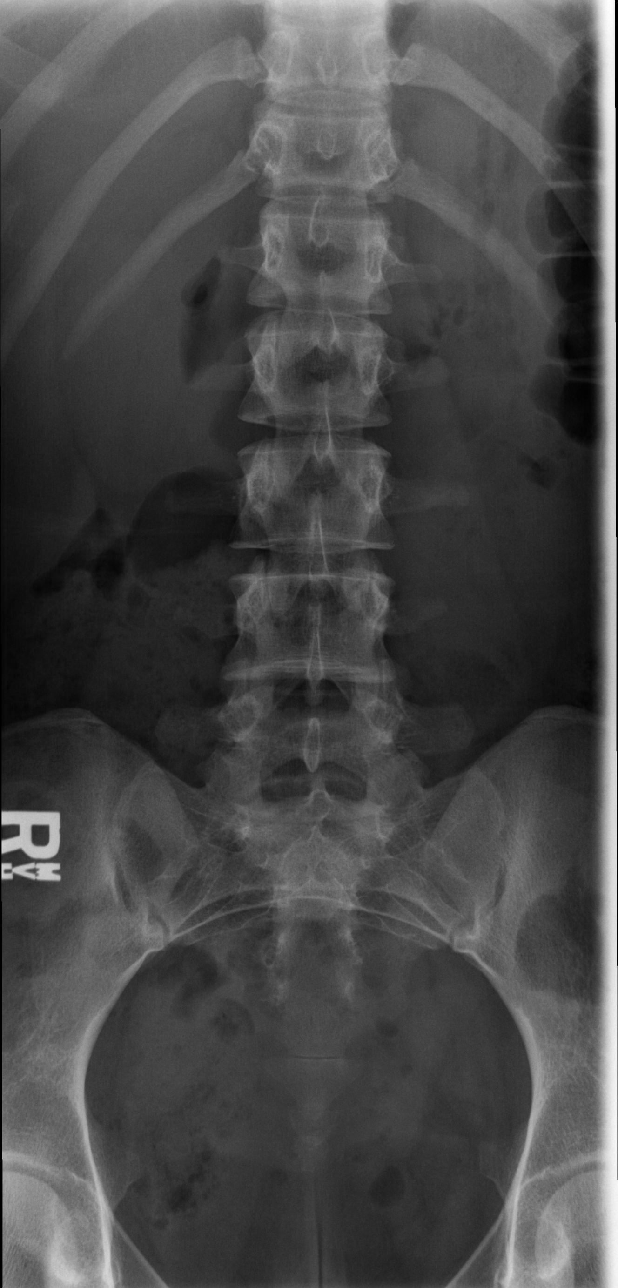

[t lumbar spine obl (1 of 2)]
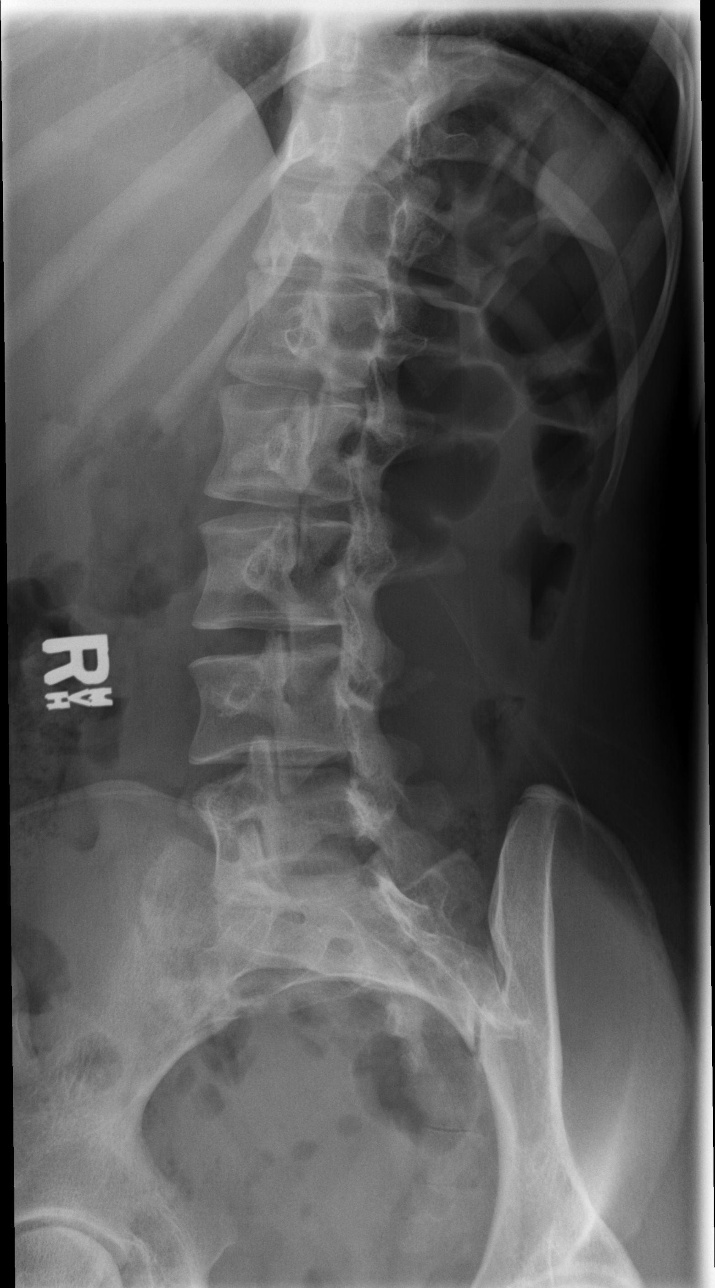

[t lumbar spine obl (2 of 2)]
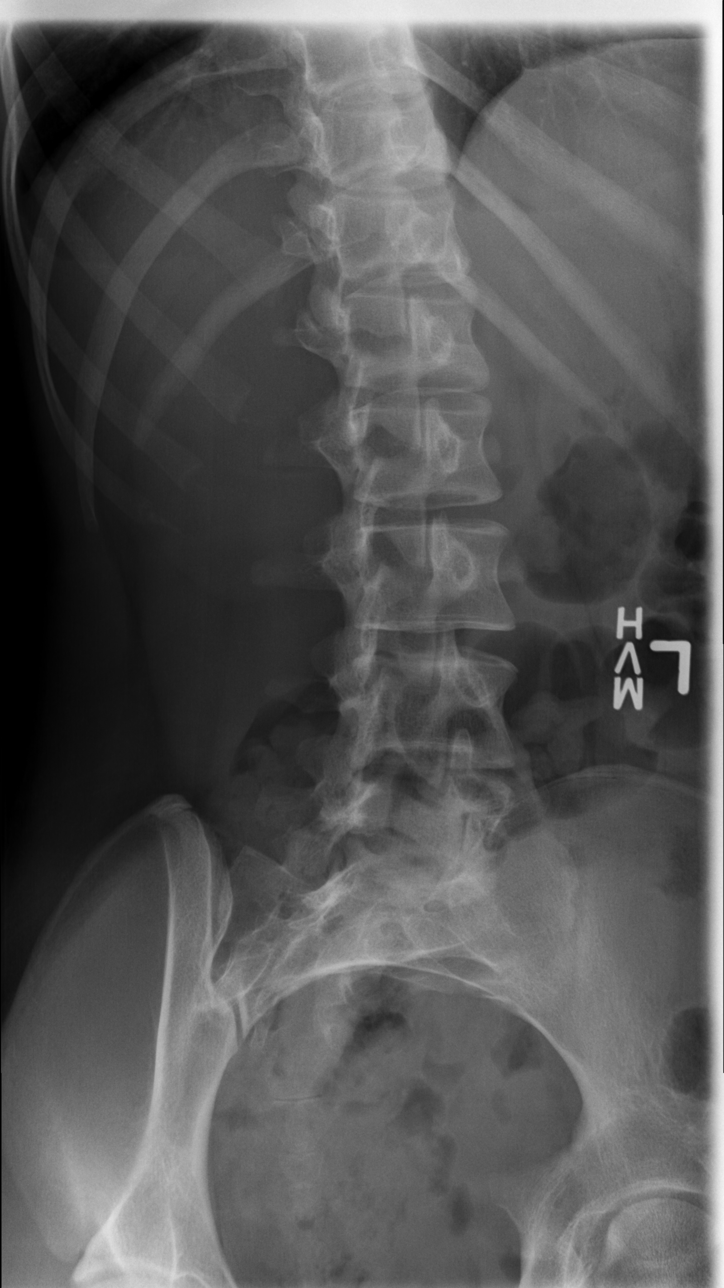

[t lumbar spine lat]
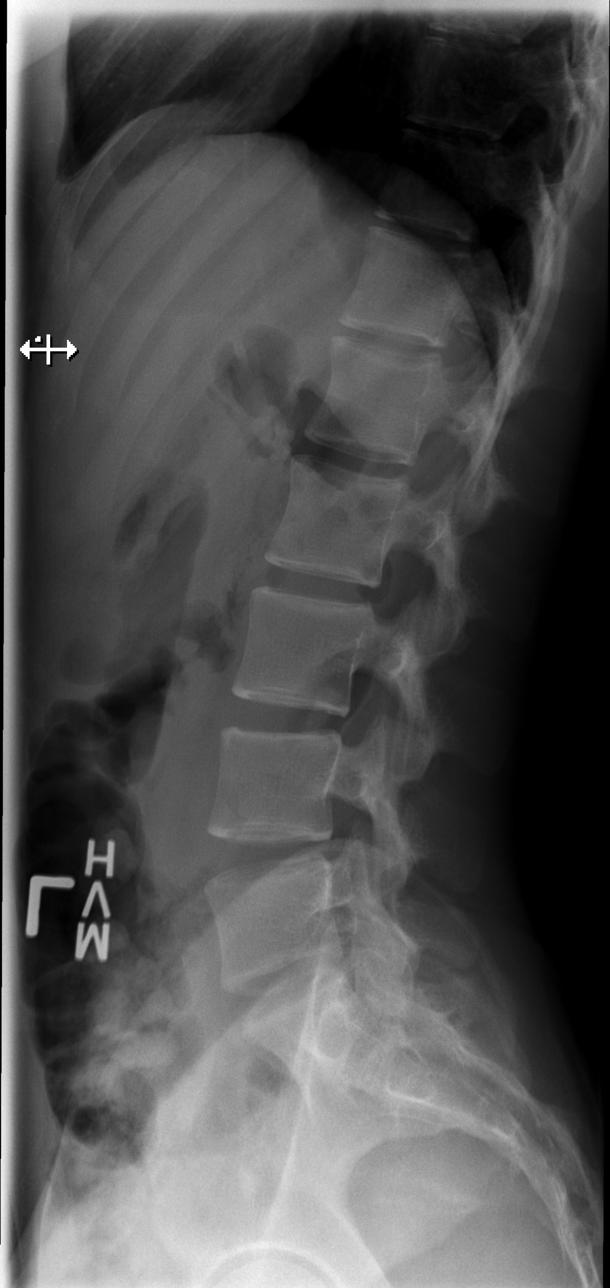

[t lumbar l-5 s-1 spot]
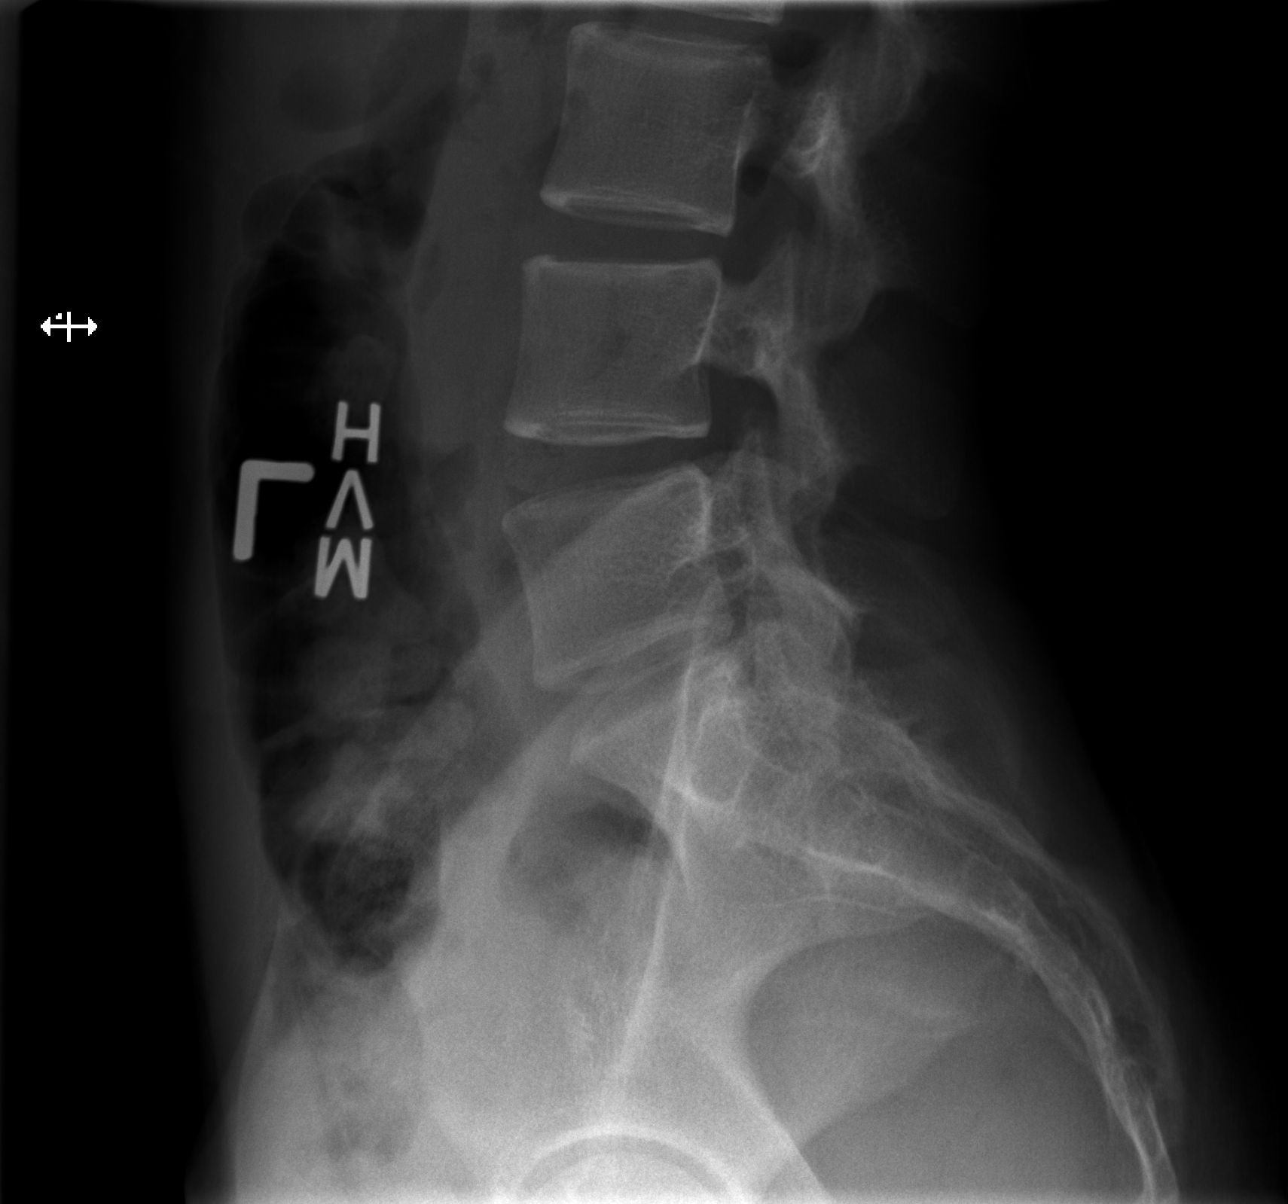

[5 of 5 positions shown; findings below may reference images not displayed]

FINDINGS: Five non rib-bearing lumbar vertebrae with anatomic alignment. No
fractures. Well-preserved disk spaces. No pars defects. No
significant facet arthropathy. No significant spondylosis.
Visualized sacroiliac joints intact.
IMPRESSION: Normal examination.

## 2018-02-27 ENCOUNTER — Ambulatory Visit (INDEPENDENT_AMBULATORY_CARE_PROVIDER_SITE_OTHER): Payer: No Typology Code available for payment source | Admitting: Family Medicine

## 2018-02-27 ENCOUNTER — Encounter: Payer: Self-pay | Admitting: Family Medicine

## 2018-02-27 VITALS — BP 98/62 | HR 74 | Temp 98.4°F | Wt 91.4 lb

## 2018-02-27 DIAGNOSIS — L239 Allergic contact dermatitis, unspecified cause: Secondary | ICD-10-CM

## 2018-02-27 MED ORDER — CETIRIZINE HCL 10 MG PO TABS: 10.0000 mg | ORAL_TABLET | Freq: Every day | ORAL | 1 refills | Status: DC

## 2018-02-27 MED ORDER — HYDROCORTISONE VALERATE 0.2 % EX CREA: 1.0000 "application " | TOPICAL_CREAM | Freq: Two times a day (BID) | CUTANEOUS | 0 refills | Status: AC

## 2018-02-27 NOTE — Patient Instructions (Signed)
Rash A rash is a change in the color of the skin. A rash can also change the way your skin feels. There are many different conditions and factors that can cause a rash. Follow these instructions at home: Pay attention to any changes in your symptoms. Follow these instructions to help with your condition: Medicine Take or apply over-the-counter and prescription medicines only as told by your health care provider. These may include:  Corticosteroid cream.  Anti-itch lotions.  Oral antihistamines.  Skin Care  Apply cool compresses to the affected areas.  Try taking a bath with: ? Epsom salts. Follow the instructions on the packaging. You can get these at your local pharmacy or grocery store. ? Baking soda. Pour a small amount into the bath as told by your health care provider. ? Colloidal oatmeal. Follow the instructions on the packaging. You can get this at your local pharmacy or grocery store.  Try applying baking soda paste to your skin. Stir water into baking soda until it reaches a paste-like consistency.  Do not scratch or rub your skin.  Avoid covering the rash. Make sure the rash is exposed to air as much as possible. General instructions  Avoid hot showers or baths, which can make itching worse. A cold shower may help.  Avoid scented soaps, detergents, and perfumes. Use gentle soaps, detergents, perfumes, and other cosmetic products.  Avoid any substance that causes your rash. Keep a journal to help track what causes your rash. Write down: ? What you eat. ? What cosmetic products you use. ? What you drink. ? What you wear. This includes jewelry.  Keep all follow-up visits as told by your health care provider. This is important. Contact a health care provider if:  You sweat at night.  You lose weight.  You urinate more than normal.  You feel weak.  You vomit.  Your skin or the whites of your eyes look yellow (jaundice).  Your skin: ? Tingles. ? Is  numb.  Your rash: ? Does not go away after several days. ? Gets worse.  You are: ? Unusually thirsty. ? More tired than normal.  You have: ? New symptoms. ? Pain in your abdomen. ? A fever. ? Diarrhea. Get help right away if:  You develop a rash that covers all or most of your body. The rash may or may not be painful.  You develop blisters that: ? Are on top of the rash. ? Grow larger or grow together. ? Are painful. ? Are inside your nose or mouth.  You develop a rash that: ? Looks like purple pinprick-sized spots all over your body. ? Has a "bull's eye" or looks like a target. ? Is not related to sun exposure, is red and painful, and causes your skin to peel. This information is not intended to replace advice given to you by your health care provider. Make sure you discuss any questions you have with your health care provider. Document Released: 10/26/2002 Document Revised: 04/10/2016 Document Reviewed: 03/23/2015 Elsevier Interactive Patient Education  2018 [employer] Inc[employer]  

## 2018-02-27 NOTE — Progress Notes (Signed)
Subjective:    Patient ID: Mariah Haas, female    DOB: 06/12/1996, 22 y.o.   MRN: [id number]  Chief Complaint  Patient presents with  . Rash  Patient is accompanied by her mother.  HPI Patient was seen today for rash.  Patient endorses itchy bumps on her arms and ankles which started a few days ago.  Pt states after appearing the bumps typically resolve in a few days.  Pt is only one in her house that has the rash.  Pt does not go outside much, just in her car to go to school.  Pt does not recall contact with any allergens.  Pt does have a dog in the home.  Pt has a history of asthma with allergies to pets.  Past Medical History:  Diagnosis Date  . Allergy   . Asthma     Allergies  Allergen Reactions  . Blueberry [Vaccinium Angustifolium]     ROS General: Denies fever, chills, night sweats, changes in weight, changes in appetite HEENT: Denies headaches, ear pain, changes in vision, rhinorrhea, sore throat CV: Denies CP, palpitations, SOB, orthopnea Pulm: Denies SOB, cough, wheezing GI: Denies abdominal pain, nausea, vomiting, diarrhea, constipation GU: Denies dysuria, hematuria, frequency, vaginal discharge Msk: Denies muscle cramps, joint pains Neuro: Denies weakness, numbness, tingling Skin: Denies rashes, bruising  + pruritic bumps on ankle and arms. Psych: Denies depression, anxiety, hallucinations     Objective:    Blood pressure 98/62, pulse 74, temperature 98.4 F (36.9 C), temperature source Oral, weight 91 lb 6.4 oz (41.5 kg), SpO2 98 %.   Gen. Pleasant, well-nourished, in no distress, normal affect   HEENT: [place]/AT, face symmetric, no scleral icterus, PERRLA, nares patent without drainage Lungs: no accessory muscle use, CTAB, no wheezes or rales Cardiovascular: RRR, no peripheral edema Neuro:  A&Ox3, CN II-XII intact, normal gait Skin:  Warm.  Erythematous wheals on upper extremities and left ankle.   Wt Readings from Last 3 Encounters:  02/27/18 91 lb  6.4 oz (41.5 kg) (<1 %, Z= -2.67)*  12/29/17 92 lb (41.7 kg) (<1 %, Z= -2.60)*  12/16/17 92 lb 12.8 oz (42.1 kg) (<1 %, Z= -2.51)*   * Growth percentiles are based on CDC (Girls, 2-20 Years) data.    Lab Results  Component Value Date   WBC 5.9 12/16/2017   HGB 13.3 12/16/2017   HCT 40.7 12/16/2017   PLT 288.0 12/16/2017   GLUCOSE 75 12/16/2017   ALT 9 (L) 07/13/2015   AST 15 07/13/2015   NA 136 12/16/2017   K 4.0 12/16/2017   CL 102 12/16/2017   CREATININE 0.72 12/16/2017   BUN 7 12/16/2017   CO2 25 12/16/2017   TSH 1.02 12/16/2017    Assessment/Plan:  Allergic dermatitis  -Wheels present.  Possibly 2/2 insect bite such as mosquito. -Patient advised to refrain from scratching as could cause a secondary bacterial infection. -Patient given handout - Plan: hydrocortisone valerate cream (WESTCORT) 0.2 %, cetirizine (ZYRTEC) 10 MG tablet -Follow-up PRN  Abbe AmsterdamShannon Banks, MD

## 2018-04-22 IMAGING — CR DG CHEST 2V
2 series · 2 of 2 positions shown · non-contrast
Comparison: 08/15/2016 and 11/04/2007

CLINICAL DATA: Cough for 3-4 days.  Shortness of breath.

EXAM:
CHEST  2 VIEW

[w chest lat]
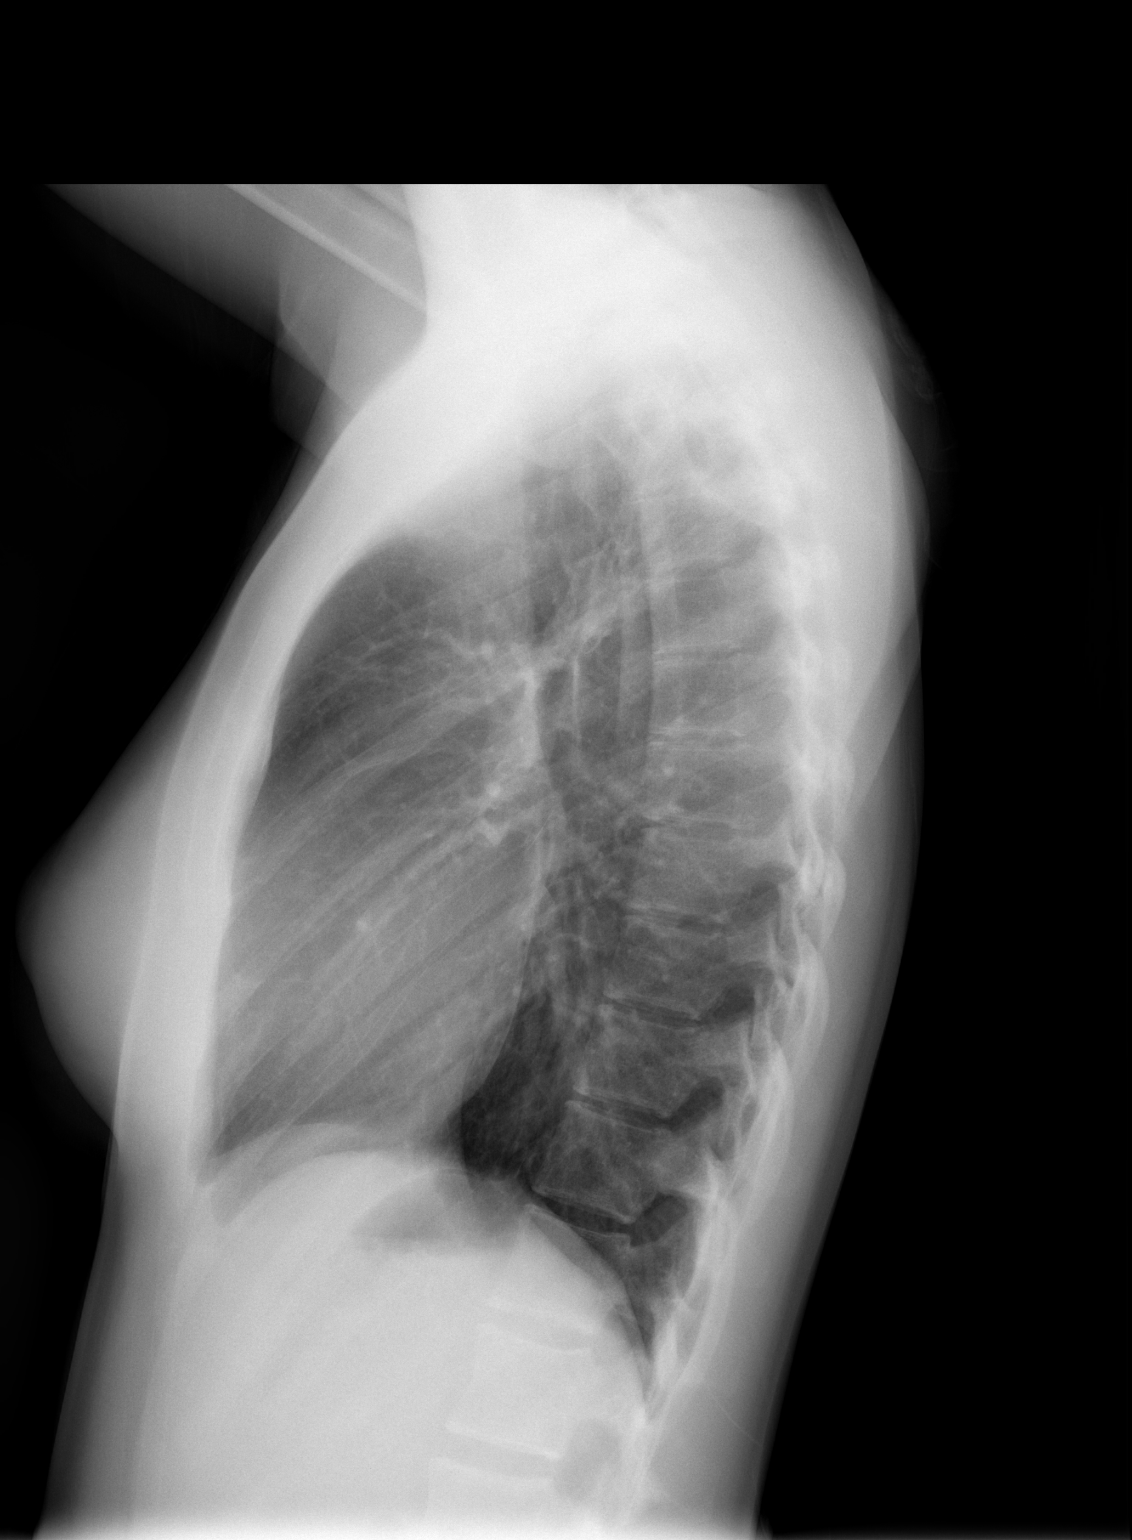

[w chest pa]
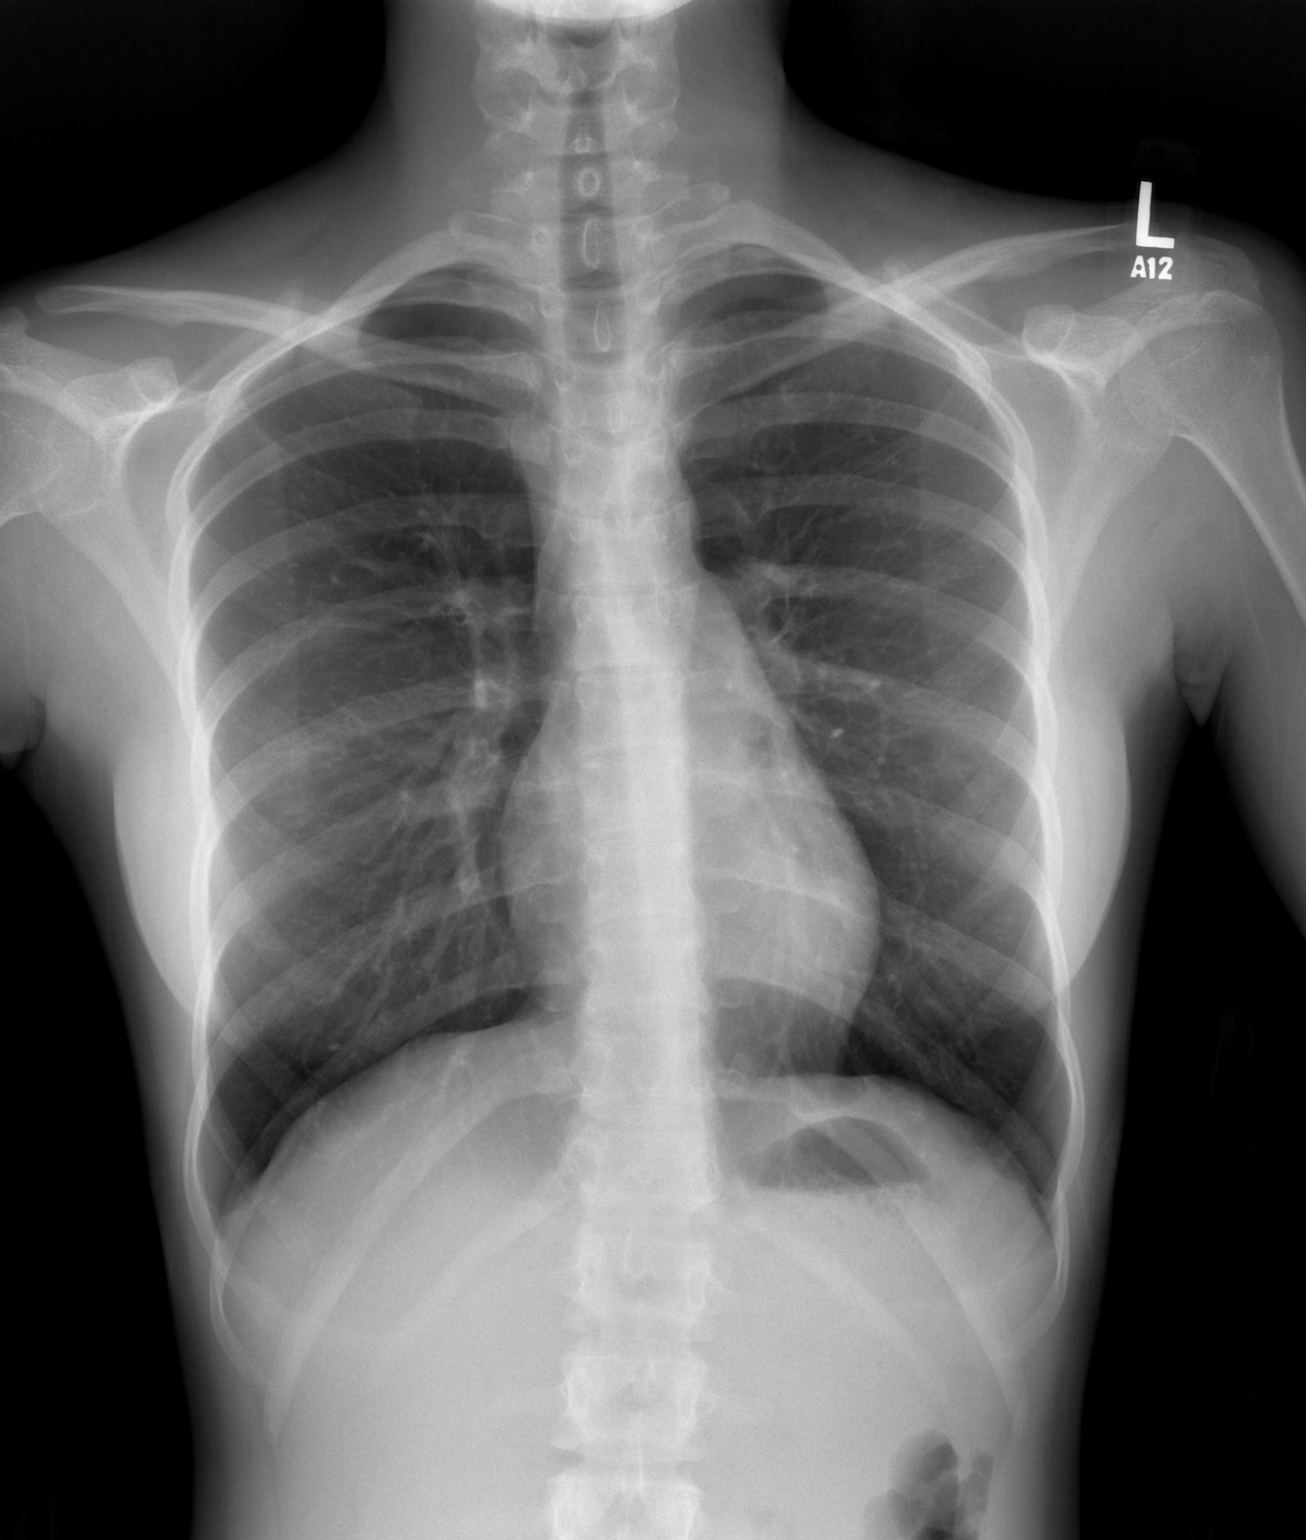

[2 of 2 positions shown; findings below may reference images not displayed]

FINDINGS: The heart size and mediastinal contours are within normal limits.
Both lungs are clear. The visualized skeletal structures are
unremarkable.
IMPRESSION: No active cardiopulmonary disease.

## 2018-05-15 ENCOUNTER — Ambulatory Visit: Payer: No Typology Code available for payment source | Admitting: Allergy & Immunology

## 2018-05-30 ENCOUNTER — Emergency Department (HOSPITAL_BASED_OUTPATIENT_CLINIC_OR_DEPARTMENT_OTHER): Payer: Self-pay

## 2018-05-30 ENCOUNTER — Other Ambulatory Visit: Payer: Self-pay

## 2018-05-30 ENCOUNTER — Emergency Department (HOSPITAL_BASED_OUTPATIENT_CLINIC_OR_DEPARTMENT_OTHER)
Admission: EM | Admit: 2018-05-30 | Discharge: 2018-05-30 | Disposition: A | Discharge status: Home / Self Care | Payer: Self-pay | Attending: Emergency Medicine | Admitting: Emergency Medicine

## 2018-05-30 DIAGNOSIS — J4520 Mild intermittent asthma, uncomplicated: Secondary | ICD-10-CM | POA: Insufficient documentation

## 2018-05-30 DIAGNOSIS — Z79899 Other long term (current) drug therapy: Secondary | ICD-10-CM | POA: Insufficient documentation

## 2018-05-30 DIAGNOSIS — M25511 Pain in right shoulder: Secondary | ICD-10-CM | POA: Insufficient documentation

## 2018-05-30 MED ORDER — IBUPROFEN 400 MG PO TABS
400.0000 mg | ORAL_TABLET | Freq: Once | ORAL | Status: AC
Start: 2018-05-30 — End: 2018-05-30
  Administered 2018-05-30: 400 mg via ORAL
  Filled 2018-05-30: qty 1

## 2018-05-30 NOTE — ED Provider Notes (Signed)
[facility] EMERGENCY DEPARTMENT Provider Note   CSN: [id number] Arrival date & time: 05/30/18  1403     History   Chief Complaint Chief Complaint  Patient presents with  . Shoulder Pain    Right    HPI Mariah Haas is a 22 y.o. female.  Patient c/o right shoulder pain in the past week. Pain constant, dull, moderate, non radiating, without specific exacerbating or alleviating factors. Denies specific shoulder injury. No hx rotator cuff problems, no hx shoulder dislocation or labrum injury. Is right hand dominant. Works as cna. Denies neck pain or radicular pain. No numbness/weakness. No cp or sob. No fever or chills.   The history is provided by the patient.  Shoulder Pain   Pertinent negatives include no numbness.    Past Medical History:  Diagnosis Date  . Allergy   . Asthma     Patient Active Problem List   Diagnosis Date Noted  . Mild intermittent asthma, uncomplicated 02/06/2018  . Seasonal and perennial allergic rhinitis 02/06/2018  . Cyst of ovary 12/16/2017  . Allergic rhinitis due to allergen 03/18/2017  . Migraine without aura and without status migrainosus, not intractable 01/28/2014    Past Surgical History:  Procedure Laterality Date  . NO PAST SURGERIES       OB History   None      Home Medications    Prior to Admission medications   Medication Sig Start Date End Date Taking? Authorizing Provider  albuterol (PROVENTIL HFA;VENTOLIN HFA) 108 (90 Base) MCG/ACT inhaler Inhale 1-2 puffs into the lungs every 6 (six) hours as needed for wheezing or shortness of breath. 11/08/17   Marcelyn Bruins, MD  cetirizine (ZYRTEC) 10 MG tablet Take 1 tablet (10 mg total) by mouth daily. 02/27/18   Deeann Saint, MD  fluticasone (FLOVENT HFA) 110 MCG/ACT inhaler Inhale 2 puffs into the lungs 2 (two) times daily. 11/08/17   Marcelyn Bruins, MD  mometasone (ASMANEX 120 METERED DOSES) 220 MCG/INH inhaler Inhale 2 puffs into  the lungs 2 (two) times daily. 02/06/18   Hetty Blend, FNP  SPRINTEC 28 0.25-35 MG-MCG tablet Take 1 tablet by mouth daily. 07/28/16   [provider]    Family History Family History  Problem Relation Age of Onset  . Seizures Mother   . Bipolar disorder Mother   . Anxiety disorder Mother   . Migraines Mother   . Headache Mother   . Diabetes Mother   . Lung cancer Maternal Grandmother   . Migraines Maternal Grandfather   . Bipolar disorder Maternal Grandfather   . Seizures Maternal Aunt        Febrile Szs as an infant  . Migraines Maternal Aunt   . Bipolar disorder Maternal Aunt   . Anxiety disorder Maternal Aunt   . Cancer Maternal Aunt        lung  . Cancer Paternal Uncle        Astrocytoma    Social History Social History   Tobacco Use  . Smoking status: Never Smoker  . Smokeless tobacco: Never Used  Substance Use Topics  . Alcohol use: No  . Drug use: No     Allergies   Blueberry [vaccinium angustifolium]   Review of Systems Review of Systems  Constitutional: Negative for fever.  Respiratory: Negative for shortness of breath.   Cardiovascular: Negative for chest pain.  Gastrointestinal: Negative for abdominal pain.  Musculoskeletal: Negative for neck pain.  Neurological: Negative for weakness and  numbness.     Physical Exam Updated Vital Signs BP 121/90 (BP Location: Right Arm)   Pulse 74   Temp 98.6 F (37 C) (Oral)   Resp 18   Ht 1.524 m (5')   Wt 42.2 kg (93 lb)   LMP 05/13/2018   SpO2 100%   BMI 18.16 kg/m   Physical Exam  Constitutional: She appears well-developed and well-nourished.  HENT:  Head: Atraumatic.  Eyes: Conjunctivae are normal. No scleral icterus.  Neck: Normal range of motion. Neck supple. No tracheal deviation present.  Cardiovascular: Normal rate, regular rhythm, normal heart sounds and intact distal pulses. Exam reveals no gallop and no friction rub.  No murmur heard. Pulmonary/Chest: Effort normal and breath  sounds normal. No respiratory distress.  Abdominal: Soft. Normal appearance and bowel sounds are normal. She exhibits no distension. There is no tenderness.  Musculoskeletal: She exhibits no edema.  Mild tenderness right shoulder. Good passive rom without pain. Pain with active abduction. Normal rom at elbow and wrist without pain. No swelling to arm or shoulder. Radial pulse 2+. C/T spine non tender, aligned, no step off.   Neurological: She is alert.  RUE motor intact, stre 5/5. sens grossly intact.   Skin: Skin is warm and dry. No rash noted.  Psychiatric: She has a normal mood and affect.  Nursing note and vitals reviewed.    ED Treatments / Results  Labs (all labs ordered are listed, but only abnormal results are displayed) Labs Reviewed - No data to display  EKG None  Radiology Dg Shoulder Right  Result Date: 05/30/2018 CLINICAL DATA:  Right shoulder pain for 1 week without known injury. EXAM: RIGHT SHOULDER - 2+ VIEW COMPARISON:  None. FINDINGS: There is no evidence of fracture or dislocation. There is no evidence of arthropathy or other focal bone abnormality. Soft tissues are unremarkable. IMPRESSION: Normal right shoulder. Electronically Signed   By: Lupita RaiderJames  Green Jr, M.D.   On: 05/30/2018 14:36    Procedures Procedures (including critical care time)  Medications Ordered in ED Medications  ibuprofen (ADVIL,MOTRIN) tablet 400 mg (has no administration in time range)     Initial Impression / Assessment and Plan / ED Course  I have reviewed the triage vital signs and the nursing notes.  Pertinent labs & imaging results that were available during my care of the patient were reviewed by me and considered in my medical decision making (see chart for details).  Imaging ordered.  Pt took no meds pta.   Motrin po.   Reviewed nursing notes and prior charts for additional history.   xrays reviewed - no fx.   Discussed xrays w pt.   rx for home.   Final Clinical  Impressions(s) / ED Diagnoses   Final diagnoses:  None    ED Discharge Orders    None       Cathren LaineSteinl, Jamillah Camilo, MD 06/01/18 [phone]

## 2018-05-30 NOTE — ED Triage Notes (Addendum)
Pt reports constant 10/10 right sided shoulder pain that radiates down her arm and lower back x1 week. Pt denies injury or fall. Pt A+OX4.

## 2018-05-30 NOTE — Discharge Instructions (Addendum)
It was our pleasure to provide your ER care today - we hope that you feel better.  Your xrays look good/normal - it is possible you have a shoulder strain/sprain.  Take motrin or aleve as need for pain.   You may also take acetaminophen as need for pain.  Avoid heavy lifting > 20 lbs, or recurrent use/lifting with right shoulder/arm for the next 3 days.   Follow up with primary care doctor in 1-2 weeks if symptoms fail to improve/resolve.  Return to ER if worse, new symptoms, fevers, swelling/redness, other concern.

## 2018-08-13 ENCOUNTER — Telehealth: Payer: Self-pay | Admitting: Family Medicine

## 2018-08-13 ENCOUNTER — Ambulatory Visit (INDEPENDENT_AMBULATORY_CARE_PROVIDER_SITE_OTHER): Payer: Self-pay | Admitting: Family Medicine

## 2018-08-13 ENCOUNTER — Encounter: Payer: Self-pay | Admitting: Family Medicine

## 2018-08-13 ENCOUNTER — Encounter: Payer: Self-pay | Admitting: *Deleted

## 2018-08-13 VITALS — BP 100/70 | HR 75 | Temp 98.9°F | Resp 12 | Ht 60.0 in | Wt 93.4 lb

## 2018-08-13 DIAGNOSIS — R1030 Lower abdominal pain, unspecified: Secondary | ICD-10-CM

## 2018-08-13 DIAGNOSIS — R112 Nausea with vomiting, unspecified: Secondary | ICD-10-CM

## 2018-08-13 DIAGNOSIS — R310 Gross hematuria: Secondary | ICD-10-CM

## 2018-08-13 DIAGNOSIS — K219 Gastro-esophageal reflux disease without esophagitis: Secondary | ICD-10-CM

## 2018-08-13 MED ORDER — OMEPRAZOLE 20 MG PO CPDR: 20.0000 mg | DELAYED_RELEASE_CAPSULE | Freq: Every day | ORAL | 0 refills | Status: DC

## 2018-08-13 NOTE — Telephone Encounter (Signed)
Copied from CRM [id number]. Topic: Quick Communication - See Telephone Encounter >> Aug 13, 2018  1:03 PM Waymon Amato wrote: PT IS NEEDING A NOTE FOR BEING OUT OF WORK TODAY AND RETURNING  WHEN THE PROVIDER FEELS SHE SHOULD BE ABLE TO RETURN   BEST NUMBER [phone]  PT IS WANTING TO HAVE THE LETTER PUT ON MY CHART FOR HER TO GET

## 2018-08-13 NOTE — Telephone Encounter (Signed)
Informed patient that letter is complete and can be printed from my chart. Patient verbalized understanding.

## 2018-08-13 NOTE — Patient Instructions (Signed)
A few things to remember from today's visit:   Gross hematuria  Lower abdominal pain  Please call [facility] in 3 to 4 days to find out about urine culture. Start Bactrim but just take it for 3 to 5 days. Strain the urine.  If you see blood in the urine again please let me know, in this case we may need to consider a kidney CT.  Please be sure medication list is accurate. If a new problem present, please set up appointment sooner than planned today.

## 2018-08-13 NOTE — Progress Notes (Signed)
ACUTE VISIT   HPI:  Chief Complaint  Patient presents with  . Dizziness    when waking with abdominal pain    Mariah Haas is a 22 y.o. female, who is here today complaining of lightheadedness, lower abdominal pain, and gross hematuria. 3 days of nausea and one episode of vomiting. She has had intermittent nausea,mainly in the morning.  Intermittent abdominal cramp pain, no radiated.  She is not sure about exacerbating or alleviating factors.   Dysuria at the end of urination. Negative for urinary frequency, urgency, or incontinence.  [facility] yesterday because gross hematuria and lower abdominal pain. She was diagnosed with UTI and started on Bactrim DM bid x 10 days.  She has not started antibiotic because she was to be sure she should be taking it. She received 1 g of Rocephin IM. No history of nephrolithiasis but strong family history. She denies vaginal bleeding or discharge. Ucx pending.  Also having 3 days of loose stools.  No sick contact or recent travel. She has not had diarrhea today. Abdominal pain has improved after she received IM Rocephin. This morning she had nausea, but seems to be better.  She also has a Rx for Zofran 4 mg.   Review of Systems  Constitutional: Positive for appetite change and fatigue. Negative for chills and fever.  HENT: Negative for congestion, mouth sores, sore throat and trouble swallowing.   Respiratory: Negative for cough, shortness of breath and wheezing.   Cardiovascular: Negative for chest pain and palpitations.  Gastrointestinal: Positive for abdominal pain, nausea and vomiting. Negative for blood in stool.  Genitourinary: Positive for hematuria. Negative for decreased urine volume, dysuria, frequency, vaginal bleeding and vaginal discharge.  Musculoskeletal: Negative for back pain and myalgias.  Skin: Negative for rash.  Neurological: Negative for weakness and headaches.  Hematological:  Negative for adenopathy. Does not bruise/bleed easily.      Current Outpatient Medications on File Prior to Visit  Medication Sig Dispense Refill  . albuterol (PROVENTIL HFA;VENTOLIN HFA) 108 (90 Base) MCG/ACT inhaler Inhale 1-2 puffs into the lungs every 6 (six) hours as needed for wheezing or shortness of breath. 1 Inhaler 5  . fluticasone (FLOVENT HFA) 110 MCG/ACT inhaler Inhale 2 puffs into the lungs 2 (two) times daily. 1 Inhaler 5  . ondansetron (ZOFRAN) 4 MG tablet Take 4 mg by mouth every 8 (eight) hours as needed for nausea or vomiting.     . SPRINTEC 28 0.25-35 MG-MCG tablet Take 1 tablet by mouth daily.  2  . sulfamethoxazole-trimethoprim (BACTRIM DS,SEPTRA DS) 800-160 MG tablet Take 1 tablet by mouth 2 (two) times daily.     No current facility-administered medications on file prior to visit.      Past Medical History:  Diagnosis Date  . Allergy   . Asthma    Allergies  Allergen Reactions  . Blueberry [Vaccinium Angustifolium]     Social History   Socioeconomic History  . Marital status: Single    Spouse name: Not on file  . Number of children: Not on file  . Years of education: Not on file  . Highest education level: Not on file  Occupational History  . Not on file  Social Needs  . Financial resource strain: Not on file  . Food insecurity:    Worry: Not on file    Inability: Not on file  . Transportation needs:    Medical: Not on file    Non-medical:  Not on file  Tobacco Use  . Smoking status: Never Smoker  . Smokeless tobacco: Never Used  Substance and Sexual Activity  . Alcohol use: No  . Drug use: No  . Sexual activity: Not on file  Lifestyle  . Physical activity:    Days per week: Not on file    Minutes per session: Not on file  . Stress: Not on file  Relationships  . Social connections:    Talks on phone: Not on file    Gets together: Not on file    Attends religious service: Not on file    Active member of club or organization: Not on  file    Attends meetings of clubs or organizations: Not on file    Relationship status: Not on file  Other Topics Concern  . Not on file  Social History Narrative  . Not on file    Vitals:   08/13/18 1212  BP: 100/70  Pulse: 75  Resp: 12  Temp: 98.9 F (37.2 C)   Body mass index is 18.24 kg/m.  Physical Exam  Nursing note and vitals reviewed. Constitutional: She is oriented to person, place, and time. She appears well-developed and well-nourished. She does not appear ill. No distress.  HENT:  Head: Normocephalic and atraumatic.  Mouth/Throat: Oropharynx is clear and moist and mucous membranes are normal.  Eyes: Conjunctivae are normal. No scleral icterus.  Cardiovascular: Normal rate and regular rhythm.  No murmur heard. Respiratory: Effort normal and breath sounds normal. No respiratory distress.  GI: Soft. Bowel sounds are normal. She exhibits no distension and no mass. There is no hepatomegaly. There is tenderness (mild) in the suprapubic area. There is no rigidity, no rebound and no guarding.  Musculoskeletal: She exhibits no edema.  Lymphadenopathy:    She has no cervical adenopathy.  Neurological: She is alert and oriented to person, place, and time. She has normal strength. Gait normal.  Skin: Skin is warm. No rash noted. No erythema.  Psychiatric: Her mood appears anxious.  Well groomed, good eye contact.    ASSESSMENT AND PLAN:   Ciin was seen today for dizziness.  Diagnoses and all orders for this visit:  Lower abdominal pain Today abdominal examination does not suggest acute abdomen. ?  Acute gastroenteritis and UTI/cystitis among some to consider. I do not think further blood work or imaging is necessary today. She was clearly instructed about warning signs.  Gross hematuria ? UTI, nephrolithiasis among some. Recommend starting Bactrim DS twice daily for 5 days. Adequate fluid intake. Ucx pending. A urine strainer was given. Clearly  instructed about warning signs. If she has another episode of hematuria, CT renal stone protocol will be considered.  Gastroesophageal reflux disease, esophagitis presence not specified,   Some of her symptom could be aggravated by GERD,she has prior history.  Recommend omeprazole 20 mg daily for 3 to 4 weeks. GERD precautions discussed.  -     omeprazole (PRILOSEC) 20 MG capsule; Take 1 capsule (20 mg total) by mouth daily before breakfast.   Nausea and vomiting in adult We discussed possible etiologies of nausea and vomiting. She has Zofran 4 mg ,recommended to take it q 8 hours prn. Bland diet and adequate hydration.    Return if symptoms worsen or fail to improve.       Yareth Macdonnell G. [place], MD  [facility]. [place] office.

## 2018-08-15 ENCOUNTER — Encounter: Payer: Self-pay | Admitting: Family Medicine

## 2018-08-21 ENCOUNTER — Ambulatory Visit: Payer: Self-pay

## 2018-09-05 ENCOUNTER — Encounter: Payer: Self-pay | Admitting: Family Medicine

## 2018-09-05 ENCOUNTER — Encounter: Payer: Self-pay | Admitting: *Deleted

## 2018-09-05 ENCOUNTER — Ambulatory Visit (INDEPENDENT_AMBULATORY_CARE_PROVIDER_SITE_OTHER): Payer: Self-pay | Admitting: Family Medicine

## 2018-09-05 VITALS — BP 122/80 | HR 82 | Temp 98.5°F | Resp 12 | Ht 60.0 in | Wt 96.2 lb

## 2018-09-05 DIAGNOSIS — J4520 Mild intermittent asthma, uncomplicated: Secondary | ICD-10-CM

## 2018-09-05 DIAGNOSIS — J069 Acute upper respiratory infection, unspecified: Secondary | ICD-10-CM

## 2018-09-05 DIAGNOSIS — J3089 Other allergic rhinitis: Secondary | ICD-10-CM

## 2018-09-05 MED ORDER — PREDNISONE 20 MG PO TABS: 40.0000 mg | ORAL_TABLET | Freq: Every day | ORAL | 0 refills | Status: AC

## 2018-09-05 MED ORDER — FLUTICASONE PROPIONATE 50 MCG/ACT NA SUSP: 1.0000 | Freq: Two times a day (BID) | NASAL | 3 refills | Status: DC

## 2018-09-05 NOTE — Patient Instructions (Addendum)
A few things to remember from today's visit:   Allergic rhinitis due to other allergic trigger, unspecified seasonality - Plan: predniSONE (DELTASONE) 20 MG tablet, fluticasone (FLONASE) 50 MCG/ACT nasal spray  URI, acute  viral infections are self-limited and we treat each symptom depending of severity.  Over the counter medications as decongestants and cold medications usually help, they need to be taken with caution if there is a history of high blood pressure or palpitations. Tylenol and/or Ibuprofen also helps with most symptoms (headache, muscle aching, fever,etc) Plenty of fluids. Honey helps with cough. Steam inhalations helps with runny nose, nasal congestion, and may prevent sinus infections. Cough and nasal congestion could last a few days and sometimes weeks. Please follow in not any better in 1-2 weeks or if symptoms get worse.  Nasal irrigation with saline several times per day.  Albuterol inh 2 puff every 6 hours for a week then as needed for wheezing or shortness of breath.    Please be sure medication list is accurate. If a new problem present, please set up appointment sooner than planned today.

## 2018-09-05 NOTE — Progress Notes (Signed)
ACUTE VISIT  HPI:  Chief Complaint  Patient presents with  . Cough    sx started 3 days ago  . Nasal Congestion  . Headache    Mariah Haas is a 22 y.o.female here today complaining of 3 days of respiratory symptoms. Rhinorrhea,nasal congestion,and post nasal drainage. Started with non productive cough yesterday. She has intermittent wheezing ans SOB, symptoms happen at rest and exertion. She has not identified exacerbating or alleviating factors. Hx of asthma, she is using Albuterol inh, last used was yesterday.  She has not noted fever,chills,body aches,or fatigue.  URI   This is a new problem. The current episode started in the past 7 days. The problem has been unchanged. There has been no fever. Associated symptoms include congestion, coughing, a plugged ear sensation and rhinorrhea. Pertinent negatives include no abdominal pain, diarrhea, ear pain, headaches, nausea, neck pain, rash, sneezing, sore throat, vomiting or wheezing. She has tried inhaler use for the symptoms. The treatment provided mild relief.   Frontal pressure like headache, 6/10, intermittent. No associated visual changes,nausea,or vomiting.  No Hx of recent travel. No sick contact. No known insect bite.  Hx of allergies: Yes,allergic rhinitis and asthma.   OTC medications for this problem: Benadryl   Review of Systems  Constitutional: Negative for activity change, appetite change, fatigue and fever.  HENT: Positive for congestion, postnasal drip and rhinorrhea. Negative for ear pain, mouth sores, sinus pressure, sneezing, sore throat, trouble swallowing and voice change.   Eyes: Negative for discharge, redness and itching.  Respiratory: Positive for cough. Negative for shortness of breath and wheezing.   Cardiovascular: Negative.   Gastrointestinal: Negative for abdominal pain, diarrhea, nausea and vomiting.  Musculoskeletal: Negative for back pain, joint swelling, myalgias and neck  pain.  Skin: Negative for rash.  Allergic/Immunologic: Negative for environmental allergies.  Neurological: Negative for weakness, numbness and headaches.  Hematological: Negative for adenopathy.      Current Outpatient Medications on File Prior to Visit  Medication Sig Dispense Refill  . albuterol (PROVENTIL HFA;VENTOLIN HFA) 108 (90 Base) MCG/ACT inhaler Inhale 1-2 puffs into the lungs every 6 (six) hours as needed for wheezing or shortness of breath. 1 Inhaler 5  . fluticasone (FLOVENT HFA) 110 MCG/ACT inhaler Inhale 2 puffs into the lungs 2 (two) times daily. 1 Inhaler 5  . SPRINTEC 28 0.25-35 MG-MCG tablet Take 1 tablet by mouth daily.  2   No current facility-administered medications on file prior to visit.      Past Medical History:  Diagnosis Date  . Allergy   . Asthma    Allergies  Allergen Reactions  . Blueberry [Vaccinium Angustifolium]     Social History   Socioeconomic History  . Marital status: Single    Spouse name: Not on file  . Number of children: Not on file  . Years of education: Not on file  . Highest education level: Not on file  Occupational History  . Not on file  Social Needs  . Financial resource strain: Not on file  . Food insecurity:    Worry: Not on file    Inability: Not on file  . Transportation needs:    Medical: Not on file    Non-medical: Not on file  Tobacco Use  . Smoking status: Never Smoker  . Smokeless tobacco: Never Used  Substance and Sexual Activity  . Alcohol use: No  . Drug use: No  . Sexual activity: Not on file  Lifestyle  .  Physical activity:    Days per week: Not on file    Minutes per session: Not on file  . Stress: Not on file  Relationships  . Social connections:    Talks on phone: Not on file    Gets together: Not on file    Attends religious service: Not on file    Active member of club or organization: Not on file    Attends meetings of clubs or organizations: Not on file    Relationship status:  Not on file  Other Topics Concern  . Not on file  Social History Narrative  . Not on file    Vitals:   09/05/18 1122  BP: 122/80  Pulse: 82  Resp: 12  Temp: 98.5 F (36.9 C)  SpO2: 98%   Body mass index is 18.8 kg/m.   Physical Exam  Nursing note and vitals reviewed. Constitutional: She is oriented to person, place, and time. She appears well-developed and well-nourished. She does not appear ill. No distress.  HENT:  Head: Normocephalic and atraumatic.  Right Ear: Tympanic membrane, external ear and ear canal normal.  Left Ear: Tympanic membrane, external ear and ear canal normal.  Nose: Rhinorrhea present. Right sinus exhibits no maxillary sinus tenderness and no frontal sinus tenderness. Left sinus exhibits no maxillary sinus tenderness and no frontal sinus tenderness.  Mouth/Throat: Oropharynx is clear and moist and mucous membranes are normal.  Nasal voice. Hypertrophic turbinates, worse right side. Postnasal drainage.  Eyes: Pupils are equal, round, and reactive to light. Conjunctivae are normal.  Cardiovascular: Normal rate and regular rhythm.  No murmur heard. Respiratory: Effort normal and breath sounds normal. No stridor. No respiratory distress.  Lymphadenopathy:       Head (right side): No submandibular adenopathy present.       Head (left side): No submandibular adenopathy present.    She has no cervical adenopathy.  Neurological: She is alert and oriented to person, place, and time. She has normal strength.  Skin: Skin is warm. No rash noted. No erythema.  Psychiatric: She has a normal mood and affect.  Well groomed, good eye contact.    ASSESSMENT AND PLAN:   Mariah Haas was seen today for cough, nasal congestion and headache.  Diagnoses and all orders for this visit:  Mild intermittent asthma, uncomplicated  Today no wheezing,rales,or rhonchi. Albuterol inh 2 puff every 6 hours for a week then as needed for wheezing or shortness of breath.  She  understands side effects of Prednisone, recommend short course. Instructed about warning signs.  -     predniSONE (DELTASONE) 20 MG tablet; Take 2 tablets (40 mg total) by mouth daily with breakfast for 3 days.  Allergic rhinitis due to other allergic trigger, unspecified seasonality  Flonase nasal spray daily prn. OTC antihistaminic may also help. Prednisone may help with nasal sinus congestion. Saline irrigations with saline recommended   -     predniSONE (DELTASONE) 20 MG tablet; Take 2 tablets (40 mg total) by mouth daily with breakfast for 3 days. -     fluticasone (FLONASE) 50 MCG/ACT nasal spray; Place 1 spray into both nostrils 2 (two) times daily.  URI, acute  Symptoms suggests a viral etiology, symptomatic treatment recommended. Explained that abx is needed at this time. Instructed to monitor for signs of complications, including new onset of fever among some, clearly instructed about warning signs. I also explained that cough and nasal congestion can last a few days and sometimes weeks. F/U as needed.  Prudencio Velazco G. [place], MD  [facility]. [place] office.

## 2018-09-10 ENCOUNTER — Ambulatory Visit: Payer: Self-pay

## 2018-09-11 ENCOUNTER — Ambulatory Visit (INDEPENDENT_AMBULATORY_CARE_PROVIDER_SITE_OTHER): Payer: Self-pay | Admitting: *Deleted

## 2018-09-11 DIAGNOSIS — Z23 Encounter for immunization: Secondary | ICD-10-CM

## 2018-12-05 ENCOUNTER — Emergency Department (HOSPITAL_BASED_OUTPATIENT_CLINIC_OR_DEPARTMENT_OTHER)
Admission: EM | Admit: 2018-12-05 | Discharge: 2018-12-05 | Disposition: A | Discharge status: Home / Self Care | Payer: Self-pay | Attending: Emergency Medicine | Admitting: Emergency Medicine

## 2018-12-05 ENCOUNTER — Encounter (HOSPITAL_BASED_OUTPATIENT_CLINIC_OR_DEPARTMENT_OTHER): Payer: Self-pay

## 2018-12-05 ENCOUNTER — Other Ambulatory Visit: Payer: Self-pay

## 2018-12-05 DIAGNOSIS — J111 Influenza due to unidentified influenza virus with other respiratory manifestations: Secondary | ICD-10-CM | POA: Insufficient documentation

## 2018-12-05 DIAGNOSIS — J45909 Unspecified asthma, uncomplicated: Secondary | ICD-10-CM | POA: Insufficient documentation

## 2018-12-05 DIAGNOSIS — R110 Nausea: Secondary | ICD-10-CM | POA: Insufficient documentation

## 2018-12-05 DIAGNOSIS — Z79899 Other long term (current) drug therapy: Secondary | ICD-10-CM | POA: Insufficient documentation

## 2018-12-05 DIAGNOSIS — R6883 Chills (without fever): Secondary | ICD-10-CM | POA: Insufficient documentation

## 2018-12-05 DIAGNOSIS — R6889 Other general symptoms and signs: Secondary | ICD-10-CM

## 2018-12-05 MED ORDER — FLUTICASONE PROPIONATE 50 MCG/ACT NA SUSP: 1.0000 | Freq: Every day | NASAL | 0 refills | Status: DC

## 2018-12-05 MED ORDER — ONDANSETRON HCL 4 MG PO TABS: 4.0000 mg | ORAL_TABLET | Freq: Four times a day (QID) | ORAL | 0 refills | Status: DC

## 2018-12-05 NOTE — Discharge Instructions (Signed)
Take Flonase once daily for nasal congestion.  You can also take nasal saline as prescribed over-the-counter.  Take Zofran every 6 hours as needed for nausea or vomiting.  Please follow-up with your doctor if your symptoms are not improving over the next 4 to 5 days.  Please return the emergency department he develop any new or worsening symptoms.

## 2018-12-05 NOTE — ED Triage Notes (Signed)
C/o URI sx x today-NAD-steady gait

## 2018-12-05 NOTE — ED Notes (Signed)
Signature pad in room not working. Pt provided verbal understanding of discharge instructions with teach back.  

## 2018-12-06 NOTE — ED Provider Notes (Signed)
[facility] EMERGENCY DEPARTMENT Provider Note   CSN: [id number] Arrival date & time: 12/05/18  2104     History   Chief Complaint Chief Complaint  Patient presents with  . URI    HPI Mariah Haas is a 23 y.o. female with history of asthma who presents with a 1 day history of nasal congestion, chills, and intermittent throat.  She took over-the-counter Mucinex with some relief.  She denies any significant cough, shortness of breath, chest pain.  She has had some intermittent nausea.  She had a couple episodes of diarrhea that are nonbloody.  She denies any abdominal pain.  She has been around some sick contacts at work.  HPI  Past Medical History:  Diagnosis Date  . Allergy   . Asthma     Patient Active Problem List   Diagnosis Date Noted  . Mild intermittent asthma, uncomplicated 02/06/2018  . Seasonal and perennial allergic rhinitis 02/06/2018  . Cyst of ovary 12/16/2017  . Allergic rhinitis due to allergen 03/18/2017  . Migraine without aura and without status migrainosus, not intractable 01/28/2014    Past Surgical History:  Procedure Laterality Date  . NO PAST SURGERIES       OB History   No obstetric history on file.      Home Medications    Prior to Admission medications   Medication Sig Start Date End Date Taking? Authorizing Provider  albuterol (PROVENTIL HFA;VENTOLIN HFA) 108 (90 Base) MCG/ACT inhaler Inhale 1-2 puffs into the lungs every 6 (six) hours as needed for wheezing or shortness of breath. 11/08/17   Marcelyn Bruins, MD  fluticasone (FLONASE) 50 MCG/ACT nasal spray Place 1 spray into both nostrils daily. 12/05/18   Analise Glotfelty, Waylan Boga, PA-C  fluticasone (FLOVENT HFA) 110 MCG/ACT inhaler Inhale 2 puffs into the lungs 2 (two) times daily. 11/08/17   Marcelyn Bruins, MD  ondansetron (ZOFRAN) 4 MG tablet Take 1 tablet (4 mg total) by mouth every 6 (six) hours. 12/05/18   Emi Holes, PA-C  SPRINTEC 28 0.25-35  MG-MCG tablet Take 1 tablet by mouth daily. 07/28/16   [provider]    Family History Family History  Problem Relation Age of Onset  . Seizures Mother   . Bipolar disorder Mother   . Anxiety disorder Mother   . Migraines Mother   . Headache Mother   . Diabetes Mother   . Lung cancer Maternal Grandmother   . Migraines Maternal Grandfather   . Bipolar disorder Maternal Grandfather   . Seizures Maternal Aunt        Febrile Szs as an infant  . Migraines Maternal Aunt   . Bipolar disorder Maternal Aunt   . Anxiety disorder Maternal Aunt   . Cancer Maternal Aunt        lung  . Cancer Paternal Uncle        Astrocytoma    Social History Social History   Tobacco Use  . Smoking status: Never Smoker  . Smokeless tobacco: Never Used  Substance Use Topics  . Alcohol use: No  . Drug use: No     Allergies   Blueberry [vaccinium angustifolium]   Review of Systems Review of Systems  Constitutional: Positive for chills.  HENT: Positive for congestion and rhinorrhea. Negative for ear pain and sore throat.   Respiratory: Negative for cough and shortness of breath.   Cardiovascular: Negative for chest pain.  Gastrointestinal: Positive for diarrhea and nausea. Negative for vomiting.  Physical Exam Updated Vital Signs BP (!) 132/93 (BP Location: Left Arm)   Pulse 84   Temp 97.8 F (36.6 C) (Oral)   Resp 20   Ht 5' (1.524 m)   Wt 43.6 kg   LMP 11/26/2018   SpO2 100%   BMI 18.77 kg/m   Physical Exam Vitals signs and nursing note reviewed.  Constitutional:      General: She is not in acute distress.    Appearance: She is well-developed. She is not diaphoretic.  HENT:     Head: Normocephalic and atraumatic.     Right Ear: Tympanic membrane normal.     Left Ear: Tympanic membrane normal.     Nose: Congestion present.     Mouth/Throat:     Pharynx: No oropharyngeal exudate or posterior oropharyngeal erythema.  Eyes:     General: No scleral icterus.        Right eye: No discharge.        Left eye: No discharge.     Conjunctiva/sclera: Conjunctivae normal.     Pupils: Pupils are equal, round, and reactive to light.  Neck:     Musculoskeletal: Normal range of motion and neck supple.     Thyroid: No thyromegaly.  Cardiovascular:     Rate and Rhythm: Normal rate and regular rhythm.     Heart sounds: Normal heart sounds. No murmur. No friction rub. No gallop.   Pulmonary:     Effort: Pulmonary effort is normal. No respiratory distress.     Breath sounds: Normal breath sounds. No stridor. No wheezing or rales.  Abdominal:     General: Bowel sounds are normal. There is no distension.     Palpations: Abdomen is soft.     Tenderness: There is no abdominal tenderness. There is no guarding or rebound.  Lymphadenopathy:     Cervical: No cervical adenopathy.  Skin:    General: Skin is warm and dry.     Coloration: Skin is not pale.     Findings: No rash.  Neurological:     Mental Status: She is alert.     Coordination: Coordination normal.      ED Treatments / Results  Labs (all labs ordered are listed, but only abnormal results are displayed) Labs Reviewed - No data to display  EKG None  Radiology No results found.  Procedures Procedures (including critical care time)  Medications Ordered in ED Medications - No data to display   Initial Impression / Assessment and Plan / ED Course  I have reviewed the triage vital signs and the nursing notes.  Pertinent labs & imaging results that were available during my care of the patient were reviewed by me and considered in my medical decision making (see chart for details).     Patient presenting with a 1 day history of flulike symptoms including nasal congestion, chills, nausea, diarrhea.  She is well-appearing.  Lungs are clear to auscultation.  She is afebrile in the ED.  I discussed Tamiflu with the patient states she is in the window, however states she is allergic.  We will treat  supportively with continued Mucinex at home, Flonase, Zofran.  Advised rest and plenty of fluids.  Return precautions discussed.  Patient understands and agrees with plan.  Patient vital stable throughout ED course and discharged in satisfactory condition.  Final Clinical Impressions(s) / ED Diagnoses   Final diagnoses:  Flu-like symptoms    ED Discharge Orders  Ordered    ondansetron (ZOFRAN) 4 MG tablet  Every 6 hours     12/05/18 2226    fluticasone (FLONASE) 50 MCG/ACT nasal spray  Daily     12/05/18 2226           Emi HolesLaw, Toussaint Golson M, PA-C 12/06/18 1543    Sabas SousBero, Michael M, MD 12/06/18 [phone]

## 2018-12-13 ENCOUNTER — Other Ambulatory Visit: Payer: Self-pay

## 2018-12-13 ENCOUNTER — Encounter (HOSPITAL_COMMUNITY): Payer: Self-pay | Admitting: Emergency Medicine

## 2018-12-13 ENCOUNTER — Ambulatory Visit (HOSPITAL_COMMUNITY)
Admission: EM | Admit: 2018-12-13 | Discharge: 2018-12-13 | Disposition: A | Discharge status: Home / Self Care | Payer: Self-pay

## 2018-12-13 DIAGNOSIS — J029 Acute pharyngitis, unspecified: Secondary | ICD-10-CM

## 2018-12-13 LAB — POCT RAPID STREP A: Streptococcus, Group A Screen (Direct): NEGATIVE

## 2018-12-13 MED ORDER — AMOXICILLIN-POT CLAVULANATE 875-125 MG PO TABS: 1.0000 | ORAL_TABLET | Freq: Two times a day (BID) | ORAL | 0 refills | Status: AC

## 2018-12-13 MED ORDER — IBUPROFEN 600 MG PO TABS: 600.0000 mg | ORAL_TABLET | Freq: Four times a day (QID) | ORAL | 0 refills | Status: DC | PRN

## 2018-12-13 NOTE — Discharge Instructions (Addendum)
Sore Throat  Your rapid strep tested Negative today. We will send for a culture and call in about 2 days if results are positive. For now we will treat your sore throat as a virus with symptom management.   I am going to start you on an antibiotic- Begin Augmentin this will cover infection in sinuses and throat, symptoms may also be related to flu  Please continue Tylenol or Ibuprofen for fever and pain. May try salt water gargles, cepacol lozenges, throat spray, or OTC cold relief medicine for throat discomfort. If you also have congestion take a daily anti-histamine like Zyrtec, Claritin, and a oral decongestant to help with post nasal drip that may be irritating your throat.   Stay hydrated and drink plenty of fluids to keep your throat coated relieve irritation.

## 2018-12-13 NOTE — ED Provider Notes (Signed)
[facility]    CSN: [id number] Arrival date & time: 12/13/18  1001     History   Chief Complaint Chief Complaint  Patient presents with  . URI    HPI Mariah Haas is a 23 y.o. female history of asthma and allergic rhinitis presenting today for evaluation of sore throat and fever.  Patient states that over 1 week ago she started to develop URI symptoms of congestion, cough and sore throat.  Symptoms were improving, but yesterday began to worsen and she began to spike a fever.  Noted fever of 100.2.  She is also had a sore throat and was concerned about strep.  Denies any cough.  She has been taking Tylenol and some of the medicines that she was prescribed when she was seen at the emergency room last Friday.  She was treated for viral illness.  Has continued to have some mild nausea.  HPI  Past Medical History:  Diagnosis Date  . Allergy   . Asthma     Patient Active Problem List   Diagnosis Date Noted  . Mild intermittent asthma, uncomplicated 02/06/2018  . Seasonal and perennial allergic rhinitis 02/06/2018  . Cyst of ovary 12/16/2017  . Allergic rhinitis due to allergen 03/18/2017  . Migraine without aura and without status migrainosus, not intractable 01/28/2014    Past Surgical History:  Procedure Laterality Date  . NO PAST SURGERIES      OB History   No obstetric history on file.      Home Medications    Prior to Admission medications   Medication Sig Start Date End Date Taking? Authorizing Provider  albuterol (PROVENTIL HFA;VENTOLIN HFA) 108 (90 Base) MCG/ACT inhaler Inhale 1-2 puffs into the lungs every 6 (six) hours as needed for wheezing or shortness of breath. 11/08/17   Marcelyn BruinsPadgett, Shaylar Patricia, MD  amoxicillin-clavulanate (AUGMENTIN) 875-125 MG tablet Take 1 tablet by mouth every 12 (twelve) hours for 10 days. 12/13/18 12/23/18  Yenny Kosa C, PA-C  fluticasone (FLONASE) 50 MCG/ACT nasal spray Place 1 spray into both nostrils daily.  12/05/18   Law, Waylan BogaAlexandra M, PA-C  fluticasone (FLOVENT HFA) 110 MCG/ACT inhaler Inhale 2 puffs into the lungs 2 (two) times daily. 11/08/17   Marcelyn BruinsPadgett, Shaylar Patricia, MD  ibuprofen (ADVIL,MOTRIN) 600 MG tablet Take 1 tablet (600 mg total) by mouth every 6 (six) hours as needed. 12/13/18   Shanon Seawright C, PA-C  ondansetron (ZOFRAN) 4 MG tablet Take 1 tablet (4 mg total) by mouth every 6 (six) hours. 12/05/18   Emi HolesLaw, Alexandra M, PA-C  SPRINTEC 28 0.25-35 MG-MCG tablet Take 1 tablet by mouth daily. 07/28/16   [provider]    Family History Family History  Problem Relation Age of Onset  . Seizures Mother   . Bipolar disorder Mother   . Anxiety disorder Mother   . Migraines Mother   . Headache Mother   . Diabetes Mother   . Lung cancer Maternal Grandmother   . Migraines Maternal Grandfather   . Bipolar disorder Maternal Grandfather   . Seizures Maternal Aunt        Febrile Szs as an infant  . Migraines Maternal Aunt   . Bipolar disorder Maternal Aunt   . Anxiety disorder Maternal Aunt   . Cancer Maternal Aunt        lung  . Cancer Paternal Uncle        Astrocytoma    Social History Social History   Tobacco Use  . Smoking  status: Never Smoker  . Smokeless tobacco: Never Used  Substance Use Topics  . Alcohol use: No  . Drug use: No     Allergies   Blueberry [vaccinium angustifolium] and Theraflu cold & [phenylephrine-pheniramine-dm]   Review of Systems Review of Systems  Constitutional: Positive for fatigue and fever. Negative for activity change, appetite change and chills.  HENT: Positive for congestion, rhinorrhea and sore throat. Negative for ear pain, sinus pressure and trouble swallowing.   Eyes: Negative for discharge and redness.  Respiratory: Negative for cough, chest tightness and shortness of breath.   Cardiovascular: Negative for chest pain.  Gastrointestinal: Positive for nausea. Negative for abdominal pain, diarrhea and vomiting.    Musculoskeletal: Positive for myalgias.  Skin: Negative for rash.  Neurological: Positive for headaches. Negative for dizziness and light-headedness.     Physical Exam Triage Vital Signs ED Triage Vitals [12/13/18 1024]  Enc Vitals Group     BP      Pulse      Resp      Temp      Temp src      SpO2      Weight      Height      Head Circumference      Peak Flow      Pain Score 9     Pain Loc      Pain Edu?      Excl. in GC?    No data found.  Updated Vital Signs BP 125/71 (BP Location: Left Arm)   Pulse (!) 107   Temp 100.1 F (37.8 C) (Oral)   Resp 18   LMP 11/26/2018   SpO2 100%   Visual Acuity Right Eye Distance:   Left Eye Distance:   Bilateral Distance:    Right Eye Near:   Left Eye Near:    Bilateral Near:     Physical Exam Vitals signs and nursing note reviewed.  Constitutional:      General: She is not in acute distress.    Appearance: She is well-developed.  HENT:     Head: Normocephalic and atraumatic.     Ears:     Comments: Bilateral ears without tenderness to palpation of external auricle, tragus and mastoid, EAC's without erythema or swelling, TM's with good bony landmarks and cone of light. Non erythematous.    Nose:     Comments: Nasal mucosa erythematous, mildly swollen turbinates    Mouth/Throat:     Comments: Oral mucosa pink and moist, no tonsillar enlargement or exudate. Posterior pharynx patent and erythematous, no uvula deviation or swelling. Normal phonation. Eyes:     Conjunctiva/sclera: Conjunctivae normal.  Neck:     Musculoskeletal: Neck supple.  Cardiovascular:     Rate and Rhythm: Normal rate and regular rhythm.     Heart sounds: No murmur.  Pulmonary:     Effort: Pulmonary effort is normal. No respiratory distress.     Breath sounds: Normal breath sounds.     Comments: Breathing comfortably at rest, CTABL, no wheezing, rales or other adventitious sounds auscultated Abdominal:     Palpations: Abdomen is soft.      Tenderness: There is no abdominal tenderness.  Skin:    General: Skin is warm and dry.  Neurological:     Mental Status: She is alert.      UC Treatments / Results  Labs (all labs ordered are listed, but only abnormal results are displayed) Labs Reviewed  CULTURE, GROUP A STREP [facility])  POCT RAPID STREP A    EKG None  Radiology No results found.  Procedures Procedures (including critical care time)  Medications Ordered in UC Medications - No data to display  Initial Impression / Assessment and Plan / UC Course  I have reviewed the triage vital signs and the nursing notes.  Pertinent labs & imaging results that were available during my care of the patient were reviewed by me and considered in my medical decision making (see chart for details).     Patient has had double sickening of her symptoms over the past day with now fever, will cover for bacterial infection in sinuses with Augmentin.  Discussed with patient that it is possible that she has more of an influenza-like illness given her fever as well.  Will also recommend further symptomatic and supportive care.  Rest, push fluids,Discussed strict return precautions. Patient verbalized understanding and is agreeable with plan.  Final Clinical Impressions(s) / UC Diagnoses   Final diagnoses:  Sore throat     Discharge Instructions     Sore Throat  Your rapid strep tested Negative today. We will send for a culture and call in about 2 days if results are positive. For now we will treat your sore throat as a virus with symptom management.   I am going to start you on an antibiotic- Begin Augmentin this will cover infection in sinuses and throat, symptoms may also be related to flu  Please continue Tylenol or Ibuprofen for fever and pain. May try salt water gargles, cepacol lozenges, throat spray, or OTC cold relief medicine for throat discomfort. If you also have congestion take a daily anti-histamine like Zyrtec,  Claritin, and a oral decongestant to help with post nasal drip that may be irritating your throat.   Stay hydrated and drink plenty of fluids to keep your throat coated relieve irritation.     ED Prescriptions    Medication Sig Dispense Auth. Provider   amoxicillin-clavulanate (AUGMENTIN) 875-125 MG tablet Take 1 tablet by mouth every 12 (twelve) hours for 10 days. 20 tablet Gabriell Daigneault C, PA-C   ibuprofen (ADVIL,MOTRIN) 600 MG tablet Take 1 tablet (600 mg total) by mouth every 6 (six) hours as needed. 30 tablet Atilano Covelli, [place] C, PA-C     Controlled Substance Prescriptions [place] Controlled Substance Registry consulted? Not Applicable   Lew Dawes, [place] 12/13/18 1051

## 2018-12-13 NOTE — ED Triage Notes (Signed)
Onset last weekend, then seemed to get better , but worsened yesterday.  Headache and sore throat yesterday.  Reports fever 100.2.    Has been seen in ED earlier in the week

## 2018-12-15 LAB — CULTURE, GROUP A STREP (THRC)

## 2018-12-19 ENCOUNTER — Telehealth: Payer: Self-pay | Admitting: Family Medicine

## 2018-12-19 ENCOUNTER — Ambulatory Visit (INDEPENDENT_AMBULATORY_CARE_PROVIDER_SITE_OTHER): Payer: Self-pay | Admitting: Family Medicine

## 2018-12-19 ENCOUNTER — Encounter: Payer: Self-pay | Admitting: Family Medicine

## 2018-12-19 VITALS — BP 98/70 | HR 82 | Temp 98.7°F | Wt 98.0 lb

## 2018-12-19 DIAGNOSIS — R0982 Postnasal drip: Secondary | ICD-10-CM

## 2018-12-19 DIAGNOSIS — R05 Cough: Secondary | ICD-10-CM

## 2018-12-19 DIAGNOSIS — R059 Cough, unspecified: Secondary | ICD-10-CM

## 2018-12-19 DIAGNOSIS — J029 Acute pharyngitis, unspecified: Secondary | ICD-10-CM

## 2018-12-19 NOTE — Telephone Encounter (Signed)
Pt would like to transfer to Dr. Salomon Fick from Dr. [place] is that okay?

## 2018-12-19 NOTE — Progress Notes (Signed)
Subjective:    Patient ID: Mariah Haas, female    DOB: 09-03-1996, 22 y.o.   MRN: [id number]  No chief complaint on file. pt accompanied by her mom.  HPI Patient was seen today for ongoing concern.  Patient endorses feeling "sick on stomach" and HA 2 weeks ago.  She was seen in urgent care, told viral.  Pt returned to a different [facility] with [facility], fever, and cough.  Per pt, strep was negative, told could be flu, but rapid flu test was not available.  Given rx for Augmentin. Took ibuprofen. Still taking the abx.  Pt here today with sore throat, dry cough worse at night.  Pt notes the sore throat is improving.  Possible sick contacts include a child at a day care.  Pt has a h/o asthma and allergies.  Is not having to use her inhaler.  Has not been taking allergy medication.  Has flonase, but does not like using it.  Past Medical History:  Diagnosis Date  . Allergy   . Asthma     Allergies  Allergen Reactions  . Blueberry [Vaccinium Angustifolium]   . Theraflu Cold & [Phenylephrine-Pheniramine-Dm]     ?sob/vomitin    ROS General: Denies fever, chills, night sweats, changes in weight, changes in appetite HEENT: Denies headaches, ear pain, changes in vision, rhinorrhea, +sore throat CV: Denies CP, palpitations, SOB, orthopnea Pulm: Denies SOB, wheezing  +cough GI: Denies abdominal pain, nausea, vomiting, diarrhea, constipation GU: Denies dysuria, hematuria, frequency, vaginal discharge Msk: Denies muscle cramps, joint pains Neuro: Denies weakness, numbness, tingling Skin: Denies rashes, bruising Psych: Denies depression, anxiety, hallucinations    Objective:    Blood pressure 98/70, pulse 82, temperature 98.7 F (37.1 C), weight 98 lb (44.5 kg), last menstrual period 11/26/2018, SpO2 99 %.  Gen. Pleasant, well-nourished, in no distress, normal affect  HEENT: [place]/AT, face symmetric, no scleral icterus, PERRLA,  nares patent without drainage, pharynx with post nasal drainage, no  erythema or exudate. TMs nml b/l.  No cervical lymphadenopathy. Lungs: no accessory muscle use, CTAB, no wheezes or rales Cardiovascular: RRR, no m/r/g, no peripheral edema Neuro:  A&Ox3, CN II-XII intact, normal gait  Wt Readings from Last 3 Encounters:  12/19/18 98 lb (44.5 kg)  12/05/18 96 lb 1.6 oz (43.6 kg)  09/05/18 96 lb 4 oz (43.7 kg)    Lab Results  Component Value Date   WBC 5.9 12/16/2017   HGB 13.3 12/16/2017   HCT 40.7 12/16/2017   PLT 288.0 12/16/2017   GLUCOSE 75 12/16/2017   ALT 9 (L) 07/13/2015   AST 15 07/13/2015   NA 136 12/16/2017   K 4.0 12/16/2017   CL 102 12/16/2017   CREATININE 0.72 12/16/2017   BUN 7 12/16/2017   CO2 25 12/16/2017   TSH 1.02 12/16/2017    Assessment/Plan:  Post-nasal drainage -likely contributing to other symptoms -given handout -discussed using allergy med such as zyrtec, claritin, or allegra -given sample of zyrtec.   Sore throat -likely 2/2 post nasal drip vs viral -discussed completing course of augmentin -gargle with warm salt water or chloraseptic spray  Cough -likely 2/2 post nasal drip vs viral -discussed supportive care -use albuterol prn    F/u prn  Abbe [place], MD

## 2018-12-19 NOTE — Patient Instructions (Signed)
Postnasal Drip Postnasal drip is the feeling of mucus going down the back of your throat. Mucus is a slimy substance that moistens and cleans your nose and throat, as well as the air pockets in face bones near your forehead and cheeks (sinuses). Small amounts of mucus pass from your nose and sinuses down the back of your throat all the time. This is normal. When you produce too much mucus or the mucus gets too thick, you can feel it. Some common causes of postnasal drip include:  Having more mucus because of: ? A cold or the flu. ? Allergies. ? Cold air. ? Certain medicines.  Having more mucus that is thicker because of: ? A sinus or nasal infection. ? Dry air. ? A food allergy. Follow these instructions at home: Relieving discomfort   Gargle with a salt-water mixture 3-4 times a day or as needed. To make a salt-water mixture, completely dissolve -1 tsp of salt in 1 cup of warm water.  If the air in your home is dry, use a humidifier to add moisture to the air.  Use a saline spray or container (neti pot) to flush out the nose (nasal irrigation). These methods can help clear away mucus and keep the nasal passages moist. General instructions  Take over-the-counter and prescription medicines only as told by your health care provider.  Follow instructions from your health care provider about eating or drinking restrictions. You may need to avoid caffeine.  Avoid things that you know you are allergic to (allergens), like dust, mold, pollen, pets, or certain foods.  Drink enough fluid to keep your urine pale yellow.  Keep all follow-up visits as told by your health care provider. This is important. Contact a health care provider if:  You have a fever.  You have a sore throat.  You have difficulty swallowing.  You have headache.  You have sinus pain.  You have a cough that does not go away.  The mucus from your nose becomes thick and is green or yellow in color.  You have  cold or flu symptoms that last more than 10 days. Summary  Postnasal drip is the feeling of mucus going down the back of your throat.  If your health care provider approves, use nasal irrigation or a nasal spray 2?4 times a day.  Avoid things that you know you are allergic to (allergens), like dust, mold, pollen, pets, or certain foods. This information is not intended to replace advice given to you by your health care provider. Make sure you discuss any questions you have with your health care provider. Document Released: 02/18/2017 Document Revised: 02/18/2017 Document Reviewed: 02/18/2017 Elsevier Interactive Patient Education  2019 Elsevier Inc.  Allergies, Adult An allergy means that your body reacts to something that bothers it (allergen). It is not a normal reaction. This can happen from something that you:  Eat.  Breathe in.  Touch. You can have an allergy (be allergic) to:  Outdoor things, like: ? Pollen. ? Grass. ? Weeds.  Indoor things, like: ? Dust. ? Smoke. ? Pet dander.  Foods.  Medicines.  Things that bother your skin, like: ? Detergents. ? Chemicals. ? Latex.  Perfume.  Bugs. An allergy cannot spread from person to person (is not contagious). Follow these instructions at home:         Stay away from things that you know you are allergic to.  If you have allergies to things in the air, wash out your nose each   day. Do it with one of these: ? A salt-water (saline) spray. ? A container (neti pot).  Take over-the-counter and prescription medicines only as told by your doctor.  Keep all follow-up visits as told by your doctor. This is important.  If you are at risk for a very bad allergy reaction (anaphylaxis), keep an auto-injector with you all the time. This is called an epinephrine injection. ? This is pre-measured medicine with a needle. You can put it into your skin by yourself. ? Right after you have a very bad allergy reaction, you or  a person with you must give the medicine in less than a few minutes. This is an emergency.  If you have ever had a very bad allergy reaction, wear a medical alert bracelet or necklace. Your very bad allergy should be written on it. Contact a health care provider if:  Your symptoms do not get better with treatment. Get help right away if:  You have symptoms of a very bad allergy reaction. These include: ? A swollen mouth, tongue, or throat. ? Pain or tightness in your chest. ? Trouble breathing. ? Being short of breath. ? Dizziness. ? Fainting. ? Very bad pain in your belly (abdomen). ? Throwing up (vomiting). ? Watery poop (diarrhea). Summary  An allergy means that your body reacts to something that bothers it (allergen). It is not a normal reaction.  Stay away from things that make your body react.  Take over-the-counter and prescription medicines only as told by your doctor.  If you are at risk for a very bad allergy reaction, carry an auto-injector (epinephrine injection) all the time. Also, wear a medical alert bracelet or necklace so people know about your allergy. This information is not intended to replace advice given to you by your health care provider. Make sure you discuss any questions you have with your health care provider. Document Released: 03/02/2013 Document Revised: 02/18/2017 Document Reviewed: 02/18/2017 Elsevier Interactive Patient Education  2019 Elsevier Inc.  

## 2018-12-22 NOTE — Telephone Encounter (Signed)
ok 

## 2018-12-23 NOTE — Telephone Encounter (Signed)
It is Ok with me. Thanks, BJ 

## 2018-12-24 NOTE — Telephone Encounter (Signed)
Pt was called and she is aware of the msg below and will call when she needs to make an appointment.

## 2019-04-22 ENCOUNTER — Other Ambulatory Visit: Payer: Self-pay

## 2019-04-22 ENCOUNTER — Ambulatory Visit (INDEPENDENT_AMBULATORY_CARE_PROVIDER_SITE_OTHER): Payer: Self-pay | Admitting: Family Medicine

## 2019-04-22 ENCOUNTER — Encounter: Payer: Self-pay | Admitting: Family Medicine

## 2019-04-22 DIAGNOSIS — K644 Residual hemorrhoidal skin tags: Secondary | ICD-10-CM

## 2019-04-22 DIAGNOSIS — K581 Irritable bowel syndrome with constipation: Secondary | ICD-10-CM

## 2019-04-22 MED ORDER — LINACLOTIDE 145 MCG PO CAPS: 145.0000 ug | ORAL_CAPSULE | Freq: Every day | ORAL | 1 refills | Status: DC

## 2019-04-22 MED ORDER — HYDROCORTISONE ACETATE 25 MG RE SUPP: 25.0000 mg | Freq: Two times a day (BID) | RECTAL | 0 refills | Status: AC

## 2019-04-22 NOTE — Progress Notes (Signed)
Virtual Visit via Video Note   I connected with Mariah Haas on 04/22/19 at  9:30 AM EDT by a video enabled telemedicine application and verified that I am speaking with the correct person using two identifiers.  Location patient: home Location provider:home office Persons participating in the virtual visit: patient, provider  I discussed the limitations of evaluation and management by telemedicine and the availability of in person appointments. The patient expressed understanding and agreed to proceed.   HPI: Mariah Haas is a 23 yo female with Hx of anxiety,allergic rhinitis,and migraine headaches complaining about 2 months of a anal "lump." Denies Hx of trauma. It is tender after defecation. There is not associated local heat. Negative for fever, chills, unusual fatigue, body aches, or change in appetite.   Problem is intermittently, resolves spontaneously after a few days.She has not identified exacerbating factor or alleviating factors. It started back about 2 weeks ago. Decreased in size with OTC hemorrhoid cream.  History of constipation, she has 2 bowel movements per week, hard stool, she usually has to strain. + Dyschezia,no hematochezia.  She has not tried OTC medication. Abdominal cramps, intermittently, alleviated with defecation. She denies associated nausea, vomiting, or urinary symptoms.  ROS: See pertinent positives and negatives per HPI.  Past Medical History:  Diagnosis Date  . Allergy   . Asthma     Past Surgical History:  Procedure Laterality Date  . NO PAST SURGERIES      Family History  Problem Relation Age of Onset  . Seizures Mother   . Bipolar disorder Mother   . Anxiety disorder Mother   . Migraines Mother   . Headache Mother   . Diabetes Mother   . Lung cancer Maternal Grandmother   . Migraines Maternal Grandfather   . Bipolar disorder Maternal Grandfather   . Seizures Maternal Aunt        Febrile Szs as an infant  . Migraines Maternal Aunt    . Bipolar disorder Maternal Aunt   . Anxiety disorder Maternal Aunt   . Cancer Maternal Aunt        lung  . Cancer Paternal Uncle        Astrocytoma    Social History   Socioeconomic History  . Marital status: Single    Spouse name: Not on file  . Number of children: Not on file  . Years of education: Not on file  . Highest education level: Not on file  Occupational History  . Not on file  Social Needs  . Financial resource strain: Not on file  . Food insecurity:    Worry: Not on file    Inability: Not on file  . Transportation needs:    Medical: Not on file    Non-medical: Not on file  Tobacco Use  . Smoking status: Never Smoker  . Smokeless tobacco: Never Used  Substance and Sexual Activity  . Alcohol use: No  . Drug use: No  . Sexual activity: Not on file  Lifestyle  . Physical activity:    Days per week: Not on file    Minutes per session: Not on file  . Stress: Not on file  Relationships  . Social connections:    Talks on phone: Not on file    Gets together: Not on file    Attends religious service: Not on file    Active member of club or organization: Not on file    Attends meetings of clubs or organizations: Not on file    Relationship  status: Not on file  . Intimate partner violence:    Fear of current or ex partner: Not on file    Emotionally abused: Not on file    Physically abused: Not on file    Forced sexual activity: Not on file  Other Topics Concern  . Not on file  Social History Narrative  . Not on file      Current Outpatient Medications:  .  albuterol (PROVENTIL HFA;VENTOLIN HFA) 108 (90 Base) MCG/ACT inhaler, Inhale 1-2 puffs into the lungs every 6 (six) hours as needed for wheezing or shortness of breath., Disp: 1 Inhaler, Rfl: 5 .  fluticasone (FLONASE) 50 MCG/ACT nasal spray, Place 1 spray into both nostrils daily., Disp: 5 g, Rfl: 0 .  fluticasone (FLOVENT HFA) 110 MCG/ACT inhaler, Inhale 2 puffs into the lungs 2 (two) times  daily., Disp: 1 Inhaler, Rfl: 5 .  hydrocortisone (ANUSOL-HC) 25 MG suppository, Place 1 suppository (25 mg total) rectally 2 (two) times daily for 7 days., Disp: 14 suppository, Rfl: 0 .  linaclotide (LINZESS) 145 MCG CAPS capsule, Take 1 capsule (145 mcg total) by mouth daily before breakfast., Disp: 30 capsule, Rfl: 1 .  SPRINTEC 28 0.25-35 MG-MCG tablet, Take 1 tablet by mouth daily., Disp: , Rfl: 2  EXAM:  VITALS per patient if applicable:N/A  GENERAL: alert, oriented, appears well and in no acute distress  HEENT: atraumatic, normocephalic conjunctiva clear, no facial obvious abnormalities on inspection.  NECK: normal movements of the head and neck  LUNGS: on inspection no signs of respiratory distress, breathing rate appears normal, no obvious gross SOB, gasping or wheezing  CV: no obvious cyanosis  GU: Deferred to next OV if needed.  MS: moves all visible extremities without noticeable abnormality  PSYCH/NEURO: pleasant and cooperative, no obvious depression or anxiety, speech and thought processing grossly intact  ASSESSMENT AND PLAN:  Discussed the following assessment and plan:  External hemorrhoids without complication - Plan: hydrocortisone (ANUSOL-HC) 25 MG suppository History she provided suggest external hemorrhoids. Recommend sitz bath daily x 10-15 min. Help with constipation/straining. Topical Anusol. If problem does not resolve after constipation is successfully treated, I will need to see her in the office.  Irritable bowel syndrome with constipation We discussed diagnostic criteria and treatment options. Recommend increasing fiber and fluid intake, OTC Benefiber 1 teaspoon twice daily is a good option for fiber sure. After discussion of pharmacologic options, she prefers to start with prescription medication. Linzess 145 mcg daily as needed recommended, we discussed side effects. Instructed about warning signs.   I discussed the assessment and  treatment plan with the patient. She was provided an opportunity to ask questions and all were answered. The patient agreed with the plan and demonstrated an understanding of the instructions.   The patient was advised to call back or seek an in-person evaluation if the symptoms worsen or if the condition fails to improve as anticipated.  Return if symptoms worsen or fail to improve.    Marysue Fait [place], MD

## 2019-04-22 NOTE — Assessment & Plan Note (Signed)
We discussed diagnostic criteria and treatment options. Recommend increasing fiber and fluid intake, OTC Benefiber 1 teaspoon twice daily is a good option for fiber sure. After discussion of pharmacologic options, she prefers to start with prescription medication. Linzess 145 mcg daily as needed recommended, we discussed side effects. Instructed about warning signs.

## 2020-02-11 ENCOUNTER — Other Ambulatory Visit: Payer: Self-pay

## 2020-02-12 ENCOUNTER — Encounter: Payer: Self-pay | Admitting: Family Medicine

## 2020-02-12 ENCOUNTER — Ambulatory Visit (INDEPENDENT_AMBULATORY_CARE_PROVIDER_SITE_OTHER): Payer: BC Managed Care – PPO | Admitting: Family Medicine

## 2020-02-12 VITALS — BP 100/70 | HR 80 | Temp 96.9°F | Resp 12 | Ht 60.0 in | Wt 92.5 lb

## 2020-02-12 DIAGNOSIS — K581 Irritable bowel syndrome with constipation: Secondary | ICD-10-CM | POA: Diagnosis not present

## 2020-02-12 DIAGNOSIS — R112 Nausea with vomiting, unspecified: Secondary | ICD-10-CM

## 2020-02-12 DIAGNOSIS — R636 Underweight: Secondary | ICD-10-CM | POA: Diagnosis not present

## 2020-02-12 DIAGNOSIS — R1010 Upper abdominal pain, unspecified: Secondary | ICD-10-CM | POA: Diagnosis not present

## 2020-02-12 LAB — BASIC METABOLIC PANEL
BUN: 7 mg/dL (ref 6–23)
CO2: 27 mEq/L (ref 19–32)
Calcium: 9.4 mg/dL (ref 8.4–10.5)
Chloride: 105 mEq/L (ref 96–112)
Creatinine, Ser: 0.77 mg/dL (ref 0.40–1.20)
GFR: 94.13 mL/min (ref >60.00)
Glucose, Bld: 95 mg/dL (ref 70–99)
Potassium: 4 mEq/L (ref 3.5–5.1)
Sodium: 138 mEq/L (ref 135–145)

## 2020-02-12 LAB — CBC
HCT: 40 % (ref 36.0–46.0)
Hemoglobin: 13.3 g/dL (ref 12.0–15.0)
MCHC: 33.3 g/dL (ref 30.0–36.0)
MCV: 89.1 fl (ref 78.0–100.0)
Platelets: 272 10*3/uL (ref 150.0–400.0)
RBC: 4.49 Mil/uL (ref 3.87–5.11)
RDW: 12.7 % (ref 11.5–15.5)
WBC: 4.1 10*3/uL (ref 4.0–10.5)

## 2020-02-12 LAB — TSH: TSH: 1.2 u[IU]/mL (ref 0.35–4.50)

## 2020-02-12 LAB — HEMOGLOBIN A1C: Hgb A1c MFr Bld: 4.9 % (ref 4.6–6.5)

## 2020-02-12 MED ORDER — ONDANSETRON HCL 4 MG PO TABS: 4.0000 mg | ORAL_TABLET | Freq: Every day | ORAL | 0 refills | Status: AC | PRN

## 2020-02-12 MED ORDER — OMEPRAZOLE 20 MG PO CPDR: 20.0000 mg | DELAYED_RELEASE_CAPSULE | Freq: Every day | ORAL | 1 refills | Status: DC

## 2020-02-12 NOTE — Progress Notes (Signed)
HPI:  Chief Complaint  Patient presents with  . Follow-up    Mariah Haas is a 24 y.o. female, who is here today complaining of intermittent episodes of "shaky" like sensation associated with nausea and sometimes vomiting for the past 2 weeks. She has not identified exacerbating or alleviating factors.   It has happened while or after eating as when skipping meals. Intermittent episodes or upper abdominal pain, LUQ and RUQ.  It is not alleviated by defecation. Problem is stable. Negative for heartburn, burping, or melena. Denies dysuria, gross hematuria, or increasing urine frequency.  She has not tried OTC medications. No more stress than usual or anxiety.  She would like blood work done.  She is also concerned about not be able to gain wt and for the past few months has lost a couple pounds. She works and attends school full time, so frequently she does not have time to eat. Negative for fever, chills, night sweats, swollen glands, CP, dyspnea, myalgias, or arthritis.  Constipation has improved, she is having bowel movements every 1 to 2 days. She is on Linzess 145 mcg daily as needed, she does not take it frequently.  Review of Systems  Constitutional: Negative for activity change, appetite change and fatigue.  HENT: Negative for mouth sores, nosebleeds, sore throat and trouble swallowing.   Respiratory: Negative for cough, shortness of breath and wheezing.   Gastrointestinal: Negative for abdominal distention.  Endocrine: Negative for cold intolerance and heat intolerance.  Genitourinary: Negative for vaginal bleeding and vaginal discharge.  Skin: Negative for pallor and rash.  Neurological: Negative for syncope, weakness and headaches.  Hematological: Does not bruise/bleed easily.  Rest see pertinent positives and negatives per HPI.  Current Outpatient Medications on File Prior to Visit  Medication Sig Dispense Refill  . albuterol (PROVENTIL  HFA;VENTOLIN HFA) 108 (90 Base) MCG/ACT inhaler Inhale 1-2 puffs into the lungs every 6 (six) hours as needed for wheezing or shortness of breath. 1 Inhaler 5  . fluticasone (FLOVENT HFA) 110 MCG/ACT inhaler Inhale 2 puffs into the lungs 2 (two) times daily. 1 Inhaler 5  . SPRINTEC 28 0.25-35 MG-MCG tablet Take 1 tablet by mouth daily.  2   No current facility-administered medications on file prior to visit.    Past Medical History:  Diagnosis Date  . Allergy   . Asthma    Allergies  Allergen Reactions  . Blueberry [Vaccinium Angustifolium]   . Theraflu Cold & [Phenylephrine-Pheniramine-Dm]     ?sob/vomitin    Social History   Socioeconomic History  . Marital status: Single    Spouse name: Not on file  . Number of children: Not on file  . Years of education: Not on file  . Highest education level: Not on file  Occupational History  . Not on file  Tobacco Use  . Smoking status: Never Smoker  . Smokeless tobacco: Never Used  Substance and Sexual Activity  . Alcohol use: No  . Drug use: No  . Sexual activity: Not on file  Other Topics Concern  . Not on file  Social History Narrative  . Not on file   Social Determinants of Health   Financial Resource Strain:   . Difficulty of Paying Living Expenses:   Food Insecurity:   . Worried About Programme researcher, broadcasting/film/video in the Last Year:   . Barista in the Last Year:   Transportation Needs:   . Freight forwarder (Medical):   Marland Kitchen  Lack of Transportation (Non-Medical):   Physical Activity:   . Days of Exercise per Week:   . Minutes of Exercise per Session:   Stress:   . Feeling of Stress :   Social Connections:   . Frequency of Communication with Friends and Family:   . Frequency of Social Gatherings with Friends and Family:   . Attends Religious Services:   . Active Member of Clubs or Organizations:   . Attends Archivist Meetings:   Marland Kitchen Marital Status:     Vitals:   02/12/20 1140  BP: 100/70  Pulse:  80  Resp: 12  Temp: (!) 96.9 F (36.1 C)   Wt Readings from Last 3 Encounters:  02/12/20 92 lb 8 oz (42 kg)  12/19/18 98 lb (44.5 kg)  12/05/18 96 lb 1.6 oz (43.6 kg)   Body mass index is 18.07 kg/m.  Physical Exam  Nursing note and vitals reviewed. Constitutional: She is oriented to person, place, and time. She appears well-developed. No distress.  HENT:  Head: Normocephalic and atraumatic.  Mouth/Throat: Oropharynx is clear and moist and mucous membranes are normal.  Eyes: Pupils are equal, round, and reactive to light. Conjunctivae are normal.  Cardiovascular: Normal rate and regular rhythm.  No murmur heard. Pulses:      Dorsalis pedis pulses are 2+ on the right side and 2+ on the left side.  Respiratory: Effort normal and breath sounds normal. No respiratory distress.  GI: Soft. She exhibits no mass. There is no hepatomegaly. There is no abdominal tenderness.  Musculoskeletal:        General: No edema.  Lymphadenopathy:    She has no cervical adenopathy.  Neurological: She is alert and oriented to person, place, and time. She has normal strength. No cranial nerve deficit. Gait normal.  Skin: Skin is warm. No rash noted. No erythema.  Psychiatric: She has a normal mood and affect.  Well groomed, good eye contact.   ASSESSMENT AND PLAN:  Mariah Haas was seen today for follow-up.  Diagnoses and all orders for this visit:  Orders Placed This Encounter  Procedures  . TSH  . CBC  . Basic metabolic panel  . Hemoglobin A1c   Lab Results  Component Value Date   TSH 1.20 02/12/2020   Lab Results  Component Value Date   CREATININE 0.77 02/12/2020   BUN 7 02/12/2020   NA 138 02/12/2020   K 4.0 02/12/2020   CL 105 02/12/2020   CO2 27 02/12/2020   Lab Results  Component Value Date   HGBA1C 4.9 02/12/2020   Lab Results  Component Value Date   WBC 4.1 02/12/2020   HGB 13.3 02/12/2020   HCT 40.0 02/12/2020   MCV 89.1 02/12/2020   PLT 272.0 02/12/2020      Non-intractable vomiting with nausea, unspecified vomiting type We discussed possible etiologies. ?  Dyspepsia, gallbladder disease among some. Zofran 4 mg to take daily as needed recommended. She agrees with trial of PPI, omeprazole 20 mg before breakfast for 3 to 4 weeks. Instructed about warning signs.  Irritable bowel syndrome with constipation Constipation has improved. Continue adequate hydration and fiber intake. Continue Linzess 145 mcg daily as needed.  Upper abdominal pain ?  Dyspepsia, IBS. Abdomen examination today negative. Omeprazole 20 mg daily may help. She voices understanding and agrees with plan.  Underweight Most likely related with poor oral intake. Strongly recommend avoiding skipping meals. If she does not have time to eat her lunch, she can replace meal  for Ensure or boost. We will continue monitoring.   Other orders -     ondansetron (ZOFRAN) 4 MG tablet; Take 1 tablet (4 mg total) by mouth daily as needed for up to 10 days for nausea or vomiting. -     omeprazole (PRILOSEC) 20 MG capsule; Take 1 capsule (20 mg total) by mouth daily before breakfast.   Return in about 2 months (around 04/13/2020).   Dinorah Masullo G. [place], MD  [facility]. [place] office.   A few things to remember from today's visit:  Try not to skip meals. Al least eat a good breakfast before leaving to work or school and dinner. You can drink an Ensure or boost to replace a meal if you do not have time to eat.  Start taking omeprazole 30-minute before breakfast and monitor for changes and nausea/vomiting.  Please be sure medication list is accurate. If a new problem present, please set up appointment sooner than planned today.

## 2020-02-12 NOTE — Patient Instructions (Signed)
A few things to remember from today's visit:  Try not to skip meals. Al least eat a good breakfast before leaving to work or school and dinner. You can drink an Ensure or boost to replace a meal if you do not have time to eat.  Start taking omeprazole 30-minute before breakfast and monitor for changes and nausea/vomiting.  Please be sure medication list is accurate. If a new problem present, please set up appointment sooner than planned today.

## 2020-02-16 ENCOUNTER — Telehealth: Payer: Self-pay | Admitting: Family Medicine

## 2020-02-16 NOTE — Telephone Encounter (Signed)
Spoke with patient and lab results given. Patient verbalized understanding.

## 2020-02-16 NOTE — Telephone Encounter (Signed)
Pt is returning Misty's call about her lab results.   Pt can be reached at [phone]

## 2020-04-23 ENCOUNTER — Ambulatory Visit: Payer: BC Managed Care – PPO | Attending: Internal Medicine

## 2020-04-23 DIAGNOSIS — Z23 Encounter for immunization: Secondary | ICD-10-CM

## 2020-04-23 NOTE — Progress Notes (Signed)
° °  Covid-19 Vaccination Clinic  Name:  Mariah Haas    MRN: [id number] DOB: Jun 02, 1996  04/23/2020  [name] was observed post Covid-19 immunization for 30 minutes based on pre-vaccination screening without incident. She was provided with Vaccine Information Sheet and instruction to access the V-Safe system.   Ms. Cheek was instructed to call 911 with any severe reactions post vaccine:  Difficulty breathing   Swelling of face and throat   A fast heartbeat   A bad rash all over body   Dizziness and weakness   Immunizations Administered    Name Date Dose VIS Date Route   Pfizer COVID-19 Vaccine 04/23/2020 11:30 AM 0.3 mL 01/13/2019 Intramuscular   Manufacturer: [employer], [employer]   Lot: [id number]   NDC: [address]-5

## 2020-04-25 ENCOUNTER — Ambulatory Visit: Payer: Self-pay | Admitting: Family Medicine

## 2020-04-29 ENCOUNTER — Ambulatory Visit: Payer: Self-pay | Admitting: Family Medicine

## 2020-04-29 DIAGNOSIS — Z0289 Encounter for other administrative examinations: Secondary | ICD-10-CM

## 2020-05-16 ENCOUNTER — Ambulatory Visit: Payer: BC Managed Care – PPO | Attending: Internal Medicine

## 2020-05-16 DIAGNOSIS — Z23 Encounter for immunization: Secondary | ICD-10-CM

## 2020-05-16 NOTE — Progress Notes (Signed)
   Covid-19 Vaccination Clinic  Name:  Mariah Haas    MRN: [id number] DOB: 07/12/1996  05/16/2020  Mariah Haas was observed post Covid-19 immunization for 30 minutes based on pre-vaccination screening without incident. She was provided with Vaccine Information Sheet and instruction to access the V-Safe system.   Mariah Haas was instructed to call 911 with any severe reactions post vaccine: Marland Kitchen Difficulty breathing  . Swelling of face and throat  . A fast heartbeat  . A bad rash all over body  . Dizziness and weakness   Immunizations Administered    Name Date Dose VIS Date Route   Pfizer COVID-19 Vaccine 05/16/2020  8:45 AM 0.3 mL 01/13/2019 Intramuscular   Manufacturer: [employer], [employer]   Lot: [id number]   NDC: [address]-1

## 2020-08-16 ENCOUNTER — Telehealth (INDEPENDENT_AMBULATORY_CARE_PROVIDER_SITE_OTHER): Payer: Self-pay | Admitting: Family Medicine

## 2020-08-16 ENCOUNTER — Encounter: Payer: Self-pay | Admitting: Family Medicine

## 2020-08-16 VITALS — Temp 98.0°F

## 2020-08-16 DIAGNOSIS — R509 Fever, unspecified: Secondary | ICD-10-CM

## 2020-08-16 DIAGNOSIS — J3089 Other allergic rhinitis: Secondary | ICD-10-CM

## 2020-08-16 DIAGNOSIS — J0100 Acute maxillary sinusitis, unspecified: Secondary | ICD-10-CM

## 2020-08-16 MED ORDER — AMOXICILLIN-POT CLAVULANATE 875-125 MG PO TABS: 1.0000 | ORAL_TABLET | Freq: Two times a day (BID) | ORAL | 0 refills | Status: AC

## 2020-08-16 NOTE — Progress Notes (Signed)
Virtual Visit via Video Note I connected with Mariah Haas on 08/16/20 by a video enabled telemedicine application and verified that I am speaking with the correct person using two identifiers.  Location patient: home Location provider:work office Persons participating in the virtual visit: patient, provider  I discussed the limitations of evaluation and management by telemedicine and the availability of in person appointments. The patient expressed understanding and agreed to proceed.  HPI: Mariah Haas is a 24 year old female with history of GERD, asthma, and allergic rhinitis complaining about 2 weeks of intermittent facial pressure pain and bilateral earache. Rhinorrhea, thick postnasal drainage, and nasal congestion. + Sneezing. + Fatigue. Problem has been getting worse for the past few days.  Allegra was helping initially. Cold meds also helped.  COVID test negative x 2, last one yesterday. Yesterday low grade fever 100 F , ear temp. Today she has been checking temperature at home, it has been normal.  She completed COVID-19 vaccination, last COVID 19 on 05/16/20. 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  Negative for associated sore throat, CP, wheezing, dyspnea, abdominal pain, N/V, changes in bowel habits, or urinary symptoms.  ROS: See pertinent positives and negatives per HPI.  Past Medical History:  Diagnosis Date  . Allergy   . Asthma     Past Surgical History:  Procedure Laterality Date  . NO PAST SURGERIES     Family History  Problem Relation Age of Onset  . Seizures Mother   . Bipolar disorder Mother   . Anxiety disorder Mother   . Migraines Mother   . Headache Mother   . Diabetes Mother   . Lung cancer Maternal Grandmother   . Migraines Maternal Grandfather   . Bipolar disorder Maternal Grandfather   . Seizures Maternal Aunt        Febrile Szs as an infant  . Migraines Maternal Aunt   . Bipolar disorder Maternal Aunt   .  Anxiety disorder Maternal Aunt   . Cancer Maternal Aunt        lung  . Cancer Paternal Uncle        Astrocytoma    Social History   Socioeconomic History  . Marital status: Single    Spouse name: Not on file  . Number of children: Not on file  . Years of education: Not on file  . Highest education level: Not on file  Occupational History  . Not on file  Tobacco Use  . Smoking status: Never Smoker  . Smokeless tobacco: Never Used  Vaping Use  . Vaping Use: Never used  Substance and Sexual Activity  . Alcohol use: No  . Drug use: No  . Sexual activity: Not on file  Other Topics Concern  . Not on file  Social History Narrative  . Not on file   Social Determinants of Health   Financial Resource Strain:   . Difficulty of Paying Living Expenses: Not on file  Food Insecurity:   . Worried About Programme researcher, broadcasting/film/video in the Last Year: Not on file  . Ran Out of Food in the Last Year: Not on file  Transportation Needs:   . Lack of Transportation (Medical): Not on file  . Lack of Transportation (Non-Medical): Not on file  Physical Activity:   . Days of Exercise per Week: Not on file  . Minutes of Exercise per Session: Not on file  Stress:   . Feeling of Stress : Not on file  Social Connections:   . Frequency of Communication with Friends and Family: Not on file  . Frequency of Social Gatherings with Friends and Family: Not on file  . Attends Religious Services: Not on file  . Active Member of Clubs or Organizations: Not on file  . Attends Banker Meetings: Not on file  . Marital Status: Not on file  Intimate Partner Violence:   . Fear of Current or Ex-Partner: Not on file  . Emotionally Abused: Not on file  . Physically Abused: Not on file  . Sexually Abused: Not on file    Current Outpatient Medications:  .  albuterol (PROVENTIL HFA;VENTOLIN HFA) 108 (90 Base) MCG/ACT inhaler, Inhale 1-2 puffs into the lungs every 6 (six) hours as needed for wheezing or  shortness of breath., Disp: 1 Inhaler, Rfl: 5 .  fluticasone (FLOVENT HFA) 110 MCG/ACT inhaler, Inhale 2 puffs into the lungs 2 (two) times daily., Disp: 1 Inhaler, Rfl: 5 .  omeprazole (PRILOSEC) 20 MG capsule, Take 1 capsule (20 mg total) by mouth daily before breakfast., Disp: 30 capsule, Rfl: 1 .  SPRINTEC 28 0.25-35 MG-MCG tablet, Take 1 tablet by mouth daily., Disp: , Rfl: 2 .  amoxicillin-clavulanate (AUGMENTIN) 875-125 MG tablet, Take 1 tablet by mouth 2 (two) times daily for 7 days., Disp: 14 tablet, Rfl: 0  EXAM:  VITALS per patient if applicable:Temp 98 F (36.7 C) (Oral)   GENERAL: alert, oriented, appears well and in no acute distress  HEENT: atraumatic, conjunctiva clear, no obvious abnormalities on inspection of external nose and ears  NECK: normal movements of the head and neck  LUNGS: on inspection no signs of respiratory distress, breathing rate appears normal, no obvious gross SOB, gasping or wheezing  CV: no obvious cyanosis  MS: moves all visible extremities without noticeable abnormality  PSYCH/NEURO: pleasant and cooperative, no obvious depression or anxiety, speech and thought processing grossly intact  ASSESSMENT AND PLAN:  Discussed the following assessment and plan:  Acute non-recurrent maxillary sinusitis - Plan: amoxicillin-clavulanate (AUGMENTIN) 875-125 MG tablet Explained the symptoms could be caused by allergies but because problem has been going on for 2 weeks and getting worse, will treat with antibiotics as a bacterial sinusitis. We discussed some side effects of abx.  Plain Mucinex may help.  If problem does not resolve, we may need to consider sinus imaging.  Allergic rhinitis due to other allergic trigger, unspecified seasonality Recommend stopping Allegra for now. Nasal saline irrigations as needed.  Low grade fever Continue monitoring temperature and new symptom. She can go back to work when fever free for 24-48 hours.    I  discussed the assessment and treatment plan with the patient. Mariah Haas was provided an opportunity to ask questions and all were answered. She agreed with the plan and demonstrated an understanding of the instructions.   Return if symptoms worsen or fail to improve.   Giannis Corpuz [place], MD

## 2020-11-29 ENCOUNTER — Telehealth: Payer: Self-pay | Admitting: Nurse Practitioner

## 2020-11-29 DIAGNOSIS — J00 Acute nasopharyngitis [common cold]: Secondary | ICD-10-CM

## 2020-11-29 MED ORDER — BENZONATATE 100 MG PO CAPS: 100.0000 mg | ORAL_CAPSULE | Freq: Three times a day (TID) | ORAL | 0 refills | Status: DC | PRN

## 2020-11-29 MED ORDER — FLUTICASONE PROPIONATE 50 MCG/ACT NA SUSP: 2.0000 | Freq: Every day | NASAL | 6 refills | Status: DC

## 2020-11-29 NOTE — Progress Notes (Signed)

## 2021-03-07 ENCOUNTER — Ambulatory Visit: Payer: Self-pay | Admitting: Family Medicine

## 2021-03-10 ENCOUNTER — Other Ambulatory Visit: Payer: Self-pay

## 2021-03-10 ENCOUNTER — Ambulatory Visit (INDEPENDENT_AMBULATORY_CARE_PROVIDER_SITE_OTHER): Payer: PRIVATE HEALTH INSURANCE | Admitting: Family Medicine

## 2021-03-10 VITALS — BP 116/80 | HR 77 | Temp 97.8°F | Resp 12 | Ht 60.0 in | Wt 89.4 lb

## 2021-03-10 DIAGNOSIS — R636 Underweight: Secondary | ICD-10-CM

## 2021-03-10 DIAGNOSIS — F419 Anxiety disorder, unspecified: Secondary | ICD-10-CM

## 2021-03-10 DIAGNOSIS — K581 Irritable bowel syndrome with constipation: Secondary | ICD-10-CM | POA: Diagnosis not present

## 2021-03-10 DIAGNOSIS — R350 Frequency of micturition: Secondary | ICD-10-CM

## 2021-03-10 LAB — BASIC METABOLIC PANEL
BUN: 9 mg/dL (ref 6–23)
CO2: 27 mEq/L (ref 19–32)
Calcium: 9.4 mg/dL (ref 8.4–10.5)
Chloride: 104 mEq/L (ref 96–112)
Creatinine, Ser: 0.67 mg/dL (ref 0.40–1.20)
GFR: 123.88 mL/min (ref >60.00)
Glucose, Bld: 75 mg/dL (ref 70–99)
Potassium: 4.5 mEq/L (ref 3.5–5.1)
Sodium: 139 mEq/L (ref 135–145)

## 2021-03-10 LAB — POCT URINALYSIS DIPSTICK
Bilirubin, UA: NEGATIVE
Blood, UA: NEGATIVE
Glucose, UA: NEGATIVE
Ketones, UA: NEGATIVE
Leukocytes, UA: NEGATIVE
Nitrite, UA: NEGATIVE
Protein, UA: NEGATIVE
Spec Grav, UA: 1.015 (ref 1.010–1.025)
Urobilinogen, UA: 0.2 E.U./dL
pH, UA: 6 (ref 5.0–8.0)

## 2021-03-10 LAB — TSH: TSH: 0.84 u[IU]/mL (ref 0.35–4.50)

## 2021-03-10 LAB — CBC
HCT: 39 % (ref 36.0–46.0)
Hemoglobin: 13 g/dL (ref 12.0–15.0)
MCHC: 33.4 g/dL (ref 30.0–36.0)
MCV: 89.1 fl (ref 78.0–100.0)
Platelets: 266 10*3/uL (ref 150.0–400.0)
RBC: 4.37 Mil/uL (ref 3.87–5.11)
RDW: 13.2 % (ref 11.5–15.5)
WBC: 3.8 10*3/uL — ABNORMAL LOW (ref 4.0–10.5)

## 2021-03-10 LAB — HEMOGLOBIN A1C: Hgb A1c MFr Bld: 5 % (ref 4.6–6.5)

## 2021-03-10 MED ORDER — MIRTAZAPINE 7.5 MG PO TABS: 7.5000 mg | ORAL_TABLET | Freq: Every day | ORAL | 1 refills | Status: DC

## 2021-03-10 NOTE — Patient Instructions (Addendum)
A few things to remember from today's visit:   Urinary frequency - Plan: POCT urinalysis dipstick, Hemoglobin A1c  Underweight - Plan: Basic metabolic panel, CBC, TSH  Irritable bowel syndrome with constipation  If you need refills please call your pharmacy. Do not use My Chart to request refills or for acute issues that need immediate attention.   Eat breakfast,lunch,and dinner. Protein like eggs,chicken,fish,and meat. Increase fruit intake, vegetables, and fluid intake.  Miralax daily at night as needed. Mirtazapine 7.5 mg 30-45 min before bedtime, it may also help with appetite.   Please be sure medication list is accurate. If a new problem present, please set up appointment sooner than planned today.

## 2021-03-10 NOTE — Progress Notes (Signed)
Chief Complaint  Patient presents with  . Urinary Frequency    Increased thirst   HPI: Mariah Haas is a 25 y.o. female, who is here today complaining of intermittent urinary symptoms. Problem has been going on for a while, felt like it was worse last week.  Problem is mainly at night, nocturia x4-5. During the day if she is busy at work she does not feel like going badly she is at home she does frequently. She has not identified exacerbating or alleviating factors. She is concerned about a serious process like diabetes.  Lab Results  Component Value Date   HGBA1C 4.9 02/12/2020   Last week she was "not feeling well." Increased fluid intake, she felt better.  Dysuria: Negative Urinary urgency: Negative Incontinence: Negative Gross hematuria: Negative LMP:02/14/21. Sexual activity: Yes. Hx of UTI: Negative. Abnormal vaginal bleeding or discharge: Negative. OTC medications for this problem: None.  Constipation: Hx of IBS. Having bowel movements every 3 to 4 days, hard, bulky, and sometimes small stools. + Dyschezia. Negative for hematochezia  Abdominal cramps and bloating sensation alleviated by defecation. Last bowel movement today. Negative for associated nausea, vomiting, blood in the stool, or melena.  She is also concerned about weight loss. Skips meals frequently. Sometimes she feels "little hungry" and drinks water instead eating a meal. Negative for fever,chills,night sweats, or swollen glands. Anxiety related with some stress at work but she does not think it is bad. She took a break from school. She is living with her fianc. Sleeping about 6 hours daily.  Lab Results  Component Value Date   TSH 1.20 02/12/2020   Lab Results  Component Value Date   WBC 4.1 02/12/2020   HGB 13.3 02/12/2020   HCT 40.0 02/12/2020   MCV 89.1 02/12/2020   PLT 272.0 02/12/2020   Lab Results  Component Value Date   CREATININE 0.77 02/12/2020   BUN 7  02/12/2020   NA 138 02/12/2020   K 4.0 02/12/2020   CL 105 02/12/2020   CO2 27 02/12/2020   Review of Systems  Constitutional: Positive for fatigue. Negative for activity change.  HENT: Negative for mouth sores, nosebleeds and sore throat.   Respiratory: Negative for cough, shortness of breath and wheezing.   Cardiovascular: Negative for chest pain, palpitations and leg swelling.  Endocrine: Negative for cold intolerance and heat intolerance.  Neurological: Negative for syncope, weakness and headaches.  Hematological: Negative for adenopathy. Does not bruise/bleed easily.  Psychiatric/Behavioral: Negative for confusion. The patient is nervous/anxious.   Rest see pertinent positives and negatives per HPI.  Current Outpatient Medications on File Prior to Visit  Medication Sig Dispense Refill  . albuterol (PROVENTIL HFA;VENTOLIN HFA) 108 (90 Base) MCG/ACT inhaler Inhale 1-2 puffs into the lungs every 6 (six) hours as needed for wheezing or shortness of breath. 1 Inhaler 5  . benzonatate (TESSALON PERLES) 100 MG capsule Take 1 capsule (100 mg total) by mouth 3 (three) times daily as needed. 20 capsule 0  . fluticasone (FLONASE) 50 MCG/ACT nasal spray Place 2 sprays into both nostrils daily. 16 g 6  . fluticasone (FLOVENT HFA) 110 MCG/ACT inhaler Inhale 2 puffs into the lungs 2 (two) times daily. 1 Inhaler 5  . omeprazole (PRILOSEC) 20 MG capsule Take 1 capsule (20 mg total) by mouth daily before breakfast. 30 capsule 1  . SPRINTEC 28 0.25-35 MG-MCG tablet Take 1 tablet by mouth daily.  2   No current facility-administered medications on file prior to  visit.   Past Medical History:  Diagnosis Date  . Allergy   . Asthma    Allergies  Allergen Reactions  . Blueberry [Vaccinium Angustifolium]   . Theraflu Cold & [Phenylephrine-Pheniramine-Dm]     ?sob/vomitin    Social History   Socioeconomic History  . Marital status: Single    Spouse name: Not on file  . Number of children: Not  on file  . Years of education: Not on file  . Highest education level: Not on file  Occupational History  . Not on file  Tobacco Use  . Smoking status: Never Smoker  . Smokeless tobacco: Never Used  Vaping Use  . Vaping Use: Never used  Substance and Sexual Activity  . Alcohol use: No  . Drug use: No  . Sexual activity: Not on file  Other Topics Concern  . Not on file  Social History Narrative  . Not on file   Social Determinants of Health   Financial Resource Strain: Not on file  Food Insecurity: Not on file  Transportation Needs: Not on file  Physical Activity: Not on file  Stress: Not on file  Social Connections: Not on file    Vitals:   03/10/21 1211  BP: 116/80  Pulse: 77  Resp: 12  Temp: 97.8 F (36.6 C)  SpO2: 96%   Wt Readings from Last 3 Encounters:  03/10/21 89 lb 6.4 oz (40.6 kg)  02/12/20 92 lb 8 oz (42 kg)  12/19/18 98 lb (44.5 kg)   Body mass index is 17.46 kg/m.  Physical Exam Vitals and nursing note reviewed.  Constitutional:      General: She is not in acute distress.    Appearance: She is well-developed and underweight.  HENT:     Head: Normocephalic and atraumatic.     Mouth/Throat:     Mouth: Mucous membranes are moist.     Pharynx: Oropharynx is clear.  Eyes:     Conjunctiva/sclera: Conjunctivae normal.     Pupils: Pupils are equal, round, and reactive to light.  Cardiovascular:     Rate and Rhythm: Normal rate and regular rhythm.     Heart sounds: No murmur heard.   Pulmonary:     Effort: Pulmonary effort is normal. No respiratory distress.     Breath sounds: Normal breath sounds.  Abdominal:     Palpations: Abdomen is soft. There is no hepatomegaly, splenomegaly or mass.     Tenderness: There is no abdominal tenderness.  Lymphadenopathy:     Cervical: No cervical adenopathy.  Skin:    General: Skin is warm.     Findings: No erythema or rash.  Neurological:     General: No focal deficit present.     Mental Status: She  is alert and oriented to person, place, and time.     Cranial Nerves: No cranial nerve deficit.     Gait: Gait normal.  Psychiatric:        Mood and Affect: Mood is anxious. Affect is labile.     Comments: Well groomed, good eye contact.   ASSESSMENT AND PLAN:  Mariah Haas was seen today for urinary frequency.  Diagnoses and all orders for this visit: Orders Placed This Encounter  Procedures  . Basic metabolic panel  . Hemoglobin A1c  . CBC  . TSH  . POCT urinalysis dipstick   Lab Results  Component Value Date   HGBA1C 5.0 03/10/2021   Lab Results  Component Value Date   CREATININE 0.67 03/10/2021  BUN 9 03/10/2021   NA 139 03/10/2021   K 4.5 03/10/2021   CL 104 03/10/2021   CO2 27 03/10/2021   Lab Results  Component Value Date   WBC 3.8 (L) 03/10/2021   HGB 13.0 03/10/2021   HCT 39.0 03/10/2021   MCV 89.1 03/10/2021   PLT 266.0 03/10/2021   Lab Results  Component Value Date   TSH 0.84 03/10/2021   Urinary frequency Urine dipstick today negative. We discussed possible etiologies. Hx does not suggest UTI.  Underweight She has lost about 9% of wt since 11/2018 and 6 % since 01/2020. We discussed the importance of keeping a regular meal schedule , avoiding skiping meals. Having a protein snack in between meals will also help. Mirtazapine 7.5 mg may also help.  Irritable bowel syndrome with constipation Adequate hydration and fiber intake recommended. OTC Miralax daily as needed.  Anxiety disorder, unspecified type Mild. Mirtazapine started today to help with appetite, may also help with this problem. Some side effects discussed.  Other orders -     mirtazapine (REMERON) 7.5 MG tablet; Take 1 tablet (7.5 mg total) by mouth at bedtime.   Spent 44 minutes.  During this time history was obtained and documented, examination was performed, prior labs reviewed, and assessment/plan discussed.  Return in about 5 weeks (around 04/14/2021) for wt check.  Jalyn Rosero G.  [place], MD  [facility]. [place] office.  A few things to remember from today's visit:   Urinary frequency - Plan: POCT urinalysis dipstick, Hemoglobin A1c  Underweight - Plan: Basic metabolic panel, CBC, TSH  Irritable bowel syndrome with constipation  If you need refills please call your pharmacy. Do not use My Chart to request refills or for acute issues that need immediate attention.   Eat breakfast,lunch,and dinner. Protein like eggs,chicken,fish,and meat. Increase fruit intake, vegetables, and fluid intake.  Miralax daily at night as needed. Mirtazapine 7.5 mg 30-45 min before bedtime, it may also help with appetite.   Please be sure medication list is accurate. If a new problem present, please set up appointment sooner than planned today.

## 2021-03-11 ENCOUNTER — Encounter: Payer: Self-pay | Admitting: Family Medicine

## 2021-04-14 ENCOUNTER — Encounter: Payer: Self-pay | Admitting: Family Medicine

## 2021-04-14 ENCOUNTER — Ambulatory Visit (INDEPENDENT_AMBULATORY_CARE_PROVIDER_SITE_OTHER): Payer: No Typology Code available for payment source | Admitting: Family Medicine

## 2021-04-14 ENCOUNTER — Other Ambulatory Visit: Payer: Self-pay

## 2021-04-14 VITALS — BP 110/70 | HR 75 | Resp 12 | Ht 60.0 in | Wt 95.0 lb

## 2021-04-14 DIAGNOSIS — F419 Anxiety disorder, unspecified: Secondary | ICD-10-CM

## 2021-04-14 DIAGNOSIS — Z23 Encounter for immunization: Secondary | ICD-10-CM | POA: Diagnosis not present

## 2021-04-14 DIAGNOSIS — R636 Underweight: Secondary | ICD-10-CM

## 2021-04-14 MED ORDER — MIRTAZAPINE 7.5 MG PO TABS: 7.5000 mg | ORAL_TABLET | Freq: Every day | ORAL | 1 refills | Status: DC

## 2021-04-14 NOTE — Patient Instructions (Addendum)
A few things to remember from today's visit:  Underweight  If you need refills please call your pharmacy. Do not use My Chart to request refills or for acute issues that need immediate attention.   Wt Readings from Last 3 Encounters:  04/14/21 95 lb (43.1 kg)  03/10/21 89 lb 6.4 oz (40.6 kg)  02/12/20 92 lb 8 oz (42 kg)   Let's continue Remeron for 3-4 months and re-evaluate next visit.  Please be sure medication list is accurate. If a new problem present, please set up appointment sooner than planned today.

## 2021-04-14 NOTE — Progress Notes (Signed)
HPI: Mariah Haas is a 25 y.o. female, who is here today to follow on recent OV. She was last seen on 03/10/21, when she was concerned about wt loss and anxiety. Mirtazapine 7.5 mg at night started. Medications has helped with appetite and anxiety. Sleeping "pretty good", 5-6 hours, she sometimes cannot get more sleep because things/chores she needs to complete before going to bed. She has tolerated medications well. She is eating 3 meals daily and snacks in between meals. Cooking at home. Snacking on grapes,yogurt,and protein bars.  Negative for CP,palpitations,SOB,N/V,abdominal pain. Constipation have improved.  Review of Systems  Constitutional: Positive for appetite change. Negative for activity change and fever.  HENT: Negative for sore throat and trouble swallowing.   Respiratory: Negative for cough and wheezing.   Endocrine: Negative for cold intolerance and heat intolerance.  Musculoskeletal: Negative for gait problem and myalgias.  Psychiatric/Behavioral: Negative for confusion, hallucinations and sleep disturbance.  Rest see pertinent positives and negatives per HPI.  Current Outpatient Medications on File Prior to Visit  Medication Sig Dispense Refill  . albuterol (PROVENTIL HFA;VENTOLIN HFA) 108 (90 Base) MCG/ACT inhaler Inhale 1-2 puffs into the lungs every 6 (six) hours as needed for wheezing or shortness of breath. 1 Inhaler 5  . fluticasone (FLONASE) 50 MCG/ACT nasal spray Place 2 sprays into both nostrils daily. 16 g 6  . fluticasone (FLOVENT HFA) 110 MCG/ACT inhaler Inhale 2 puffs into the lungs 2 (two) times daily. 1 Inhaler 5  . omeprazole (PRILOSEC) 20 MG capsule Take 1 capsule (20 mg total) by mouth daily before breakfast. 30 capsule 1  . SPRINTEC 28 0.25-35 MG-MCG tablet Take 1 tablet by mouth daily.  2   No current facility-administered medications on file prior to visit.   Past Medical History:  Diagnosis Date  . Allergy   . Asthma     Allergies  Allergen Reactions  . Blueberry [Vaccinium Angustifolium]   . Theraflu Cold & [Phenylephrine-Pheniramine-Dm]     ?sob/vomitin    Social History   Socioeconomic History  . Marital status: Single    Spouse name: Not on file  . Number of children: Not on file  . Years of education: Not on file  . Highest education level: Not on file  Occupational History  . Not on file  Tobacco Use  . Smoking status: Never Smoker  . Smokeless tobacco: Never Used  Vaping Use  . Vaping Use: Never used  Substance and Sexual Activity  . Alcohol use: No  . Drug use: No  . Sexual activity: Not on file  Other Topics Concern  . Not on file  Social History Narrative  . Not on file   Social Determinants of Health   Financial Resource Strain: Not on file  Food Insecurity: Not on file  Transportation Needs: Not on file  Physical Activity: Not on file  Stress: Not on file  Social Connections: Not on file   Vitals:   04/14/21 1143  BP: 110/70  Pulse: 75  Resp: 12  SpO2: 99%   Wt Readings from Last 3 Encounters:  04/14/21 95 lb (43.1 kg)  03/10/21 89 lb 6.4 oz (40.6 kg)  02/12/20 92 lb 8 oz (42 kg)   Body mass index is 18.55 kg/m.  Physical Exam Vitals and nursing note reviewed.  Constitutional:      General: She is not in acute distress.    Appearance: She is well-developed.  HENT:     Head: Normocephalic and atraumatic.  Eyes:     Conjunctiva/sclera: Conjunctivae normal.  Cardiovascular:     Rate and Rhythm: Normal rate and regular rhythm.     Heart sounds: No murmur heard.   Pulmonary:     Effort: Pulmonary effort is normal. No respiratory distress.     Breath sounds: Normal breath sounds.  Abdominal:     Palpations: Abdomen is soft. There is no hepatomegaly or mass.     Tenderness: There is no abdominal tenderness.  Skin:    General: Skin is warm.     Findings: No erythema or rash.  Neurological:     Mental Status: She is alert and oriented to person,  place, and time.     Cranial Nerves: No cranial nerve deficit.     Gait: Gait normal.  Psychiatric:     Comments: Well groomed, good eye contact.   ASSESSMENT AND PLAN:  [name] was seen today for follow-up.  Diagnoses and all orders for this visit:  Underweight He has gained about 6 Lb since her last visit. Continue a healthful diet Continue Mirtazapine same dose.Some side effects discussed.  -     mirtazapine (REMERON) 7.5 MG tablet; Take 1 tablet (7.5 mg total) by mouth at bedtime.  Need for Tdap vaccination -     Tdap vaccine greater than or equal to 7yo IM  Anxiety disorder, unspecified type Improved. Continue Mirtazapine.  Return in about 4 months (around 08/15/2021) for cpe.  [name], MD  [facility]. [place] office.  A few things to remember from today's visit:  Underweight  If you need refills please call your pharmacy. Do not use My Chart to request refills or for acute issues that need immediate attention.   Wt Readings from Last 3 Encounters:  04/14/21 95 lb (43.1 kg)  03/10/21 89 lb 6.4 oz (40.6 kg)  02/12/20 92 lb 8 oz (42 kg)   Let's continue Remeron for 3-4 months and re-evaluate next visit.  Please be sure medication list is accurate. If a new problem present, please set up appointment sooner than planned today.

## 2021-05-20 ENCOUNTER — Other Ambulatory Visit: Payer: Self-pay

## 2021-05-20 ENCOUNTER — Emergency Department (HOSPITAL_COMMUNITY)
Admission: EM | Admit: 2021-05-20 | Discharge: 2021-05-20 | Disposition: A | Discharge status: Home / Self Care | Payer: No Typology Code available for payment source | Attending: Emergency Medicine | Admitting: Emergency Medicine

## 2021-05-20 ENCOUNTER — Encounter (HOSPITAL_COMMUNITY): Payer: Self-pay | Admitting: Emergency Medicine

## 2021-05-20 DIAGNOSIS — J4520 Mild intermittent asthma, uncomplicated: Secondary | ICD-10-CM | POA: Diagnosis not present

## 2021-05-20 DIAGNOSIS — R42 Dizziness and giddiness: Secondary | ICD-10-CM | POA: Diagnosis not present

## 2021-05-20 DIAGNOSIS — R519 Headache, unspecified: Secondary | ICD-10-CM | POA: Insufficient documentation

## 2021-05-20 DIAGNOSIS — R55 Syncope and collapse: Secondary | ICD-10-CM | POA: Diagnosis not present

## 2021-05-20 DIAGNOSIS — R0789 Other chest pain: Secondary | ICD-10-CM | POA: Diagnosis not present

## 2021-05-20 DIAGNOSIS — H538 Other visual disturbances: Secondary | ICD-10-CM | POA: Insufficient documentation

## 2021-05-20 HISTORY — DX: Anxiety disorder, unspecified: F41.9

## 2021-05-20 LAB — CBG MONITORING, ED: Glucose-Capillary: 107 mg/dL — ABNORMAL HIGH (ref 70–99)

## 2021-05-20 LAB — CBC
HCT: 39.4 % (ref 36.0–46.0)
Hemoglobin: 12.6 g/dL (ref 12.0–15.0)
MCH: 29 pg (ref 26.0–34.0)
MCHC: 32 g/dL (ref 30.0–36.0)
MCV: 90.8 fL (ref 80.0–100.0)
Platelets: 271 10*3/uL (ref 150–400)
RBC: 4.34 MIL/uL (ref 3.87–5.11)
RDW: 12.1 % (ref 11.5–15.5)
WBC: 4.6 10*3/uL (ref 4.0–10.5)
nRBC: 0 % (ref 0.0–0.2)

## 2021-05-20 LAB — BASIC METABOLIC PANEL
Anion gap: 6 (ref 5–15)
BUN: 7 mg/dL (ref 6–20)
CO2: 25 mmol/L (ref 22–32)
Calcium: 9.1 mg/dL (ref 8.9–10.3)
Chloride: 105 mmol/L (ref 98–111)
Creatinine, Ser: 0.8 mg/dL (ref 0.44–1.00)
GFR, Estimated: >60 mL/min (ref >60)
Glucose, Bld: 112 mg/dL — ABNORMAL HIGH (ref 70–99)
Potassium: 3.5 mmol/L (ref 3.5–5.1)
Sodium: 136 mmol/L (ref 135–145)

## 2021-05-20 LAB — I-STAT BETA HCG BLOOD, ED (MC, WL, AP ONLY): I-stat hCG, quantitative: <5 m[IU]/mL (ref <5)

## 2021-05-20 MED ORDER — SODIUM CHLORIDE 0.9% FLUSH: 3.0000 mL | Freq: Once | INTRAVENOUS | Status: DC

## 2021-05-20 NOTE — ED Triage Notes (Signed)
Pt was working as a Best boy in [employer] and got up to do vitals and started having blurred vision, chest pain, nausea, dizziness, and felt like she was going to pass out.  Drank ginger ale and laid down for about 10 min.  Reports BP was 75/54 and staff said she was pale.  Pt denies chest pain at present.  History of anxiety and asthma.

## 2021-05-20 NOTE — Discharge Instructions (Addendum)
You have been evaluated for your symptoms.  Fortunately your EKG, and labs did not show any concerning finding.  You have a normal orthostatic vital sign.  Your blood sugar is normal.  Your symptoms may be due to a vasovagal near syncope episode.  Call and follow-up closely with your doctor for outpatient evaluation.  Return if any concern.

## 2021-05-20 NOTE — ED Provider Notes (Signed)
[facility] EMERGENCY DEPARTMENT Provider Note   CSN: [id number] Arrival date & time: 05/20/21  8341     History No chief complaint on file.   Mariah Haas is a 25 y.o. female.  The history is provided by the patient and medical records. No language interpreter was used.    26 year old female significant history of asthma, allergies, anxiety, who with a medical staff at the hospital presenting for evaluation of a near syncopal episode.  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  She recall getting up to help patient and while waiting to do a set of vital sign on patient she then experienced a sensation of lightheadedness, blurry vision, felt very weak and feels the need to sit down.  As she went to the nursing station and reportedly was told that she appears pale.  Her blood pressure was checked at that time and she had a blood pressure of 75/54.  CBG obtained at 107.  During this episode patient felt nauseous as if she was going to vomit.  She felt shaky.  Also endorsed a mild headache.  She endorsed mild chest discomfort as well lasting for few minutes.  She decided to come to the ER.  While waiting she reported symptoms mostly resolved.  She is up-to-date with immunization she denies any recent medication changes or any recent sickness.  She did endorse that she has a family history of seizure.  She recall having 1 similar syncopal episode several months ago and states her work-up was unremarkable.  She did eat a light breakfast.     Past Medical History:  Diagnosis Date   Allergy    Anxiety    Asthma     Patient Active Problem List   Diagnosis Date Noted   Irritable bowel syndrome with constipation 04/22/2019   Mild intermittent asthma, uncomplicated 02/06/2018   Seasonal and perennial allergic rhinitis 02/06/2018   Cyst of ovary 12/16/2017   Allergic rhinitis due to allergen 03/18/2017   Migraine without aura  and without status migrainosus, not intractable 01/28/2014    Past Surgical History:  Procedure Laterality Date   NO PAST SURGERIES       OB History   No obstetric history on file.     Family History  Problem Relation Age of Onset   Seizures Mother    Bipolar disorder Mother    Anxiety disorder Mother    Migraines Mother    Headache Mother    Diabetes Mother    Lung cancer Maternal Grandmother    Migraines Maternal Grandfather    Bipolar disorder Maternal Grandfather    Seizures Maternal Aunt        Febrile Szs as an infant   Migraines Maternal Aunt    Bipolar disorder Maternal Aunt    Anxiety disorder Maternal Aunt    Cancer Maternal Aunt        lung   Cancer Paternal Uncle        Astrocytoma    Social History   Tobacco Use   Smoking status: Never   Smokeless tobacco: Never  Vaping Use   Vaping Use: Never used  Substance Use Topics   Alcohol use: No   Drug use: No    Home Medications Prior to Admission medications   Medication Sig Start Date End Date Taking? Authorizing Provider  mirtazapine (REMERON) 7.5 MG tablet Take 1 tablet (7.5 mg total) by mouth at bedtime.  04/14/21  Yes [place], Betty G, MD  SPRINTEC 28 0.25-35 MG-MCG tablet Take 1 tablet by mouth daily. 07/28/16  Yes [provider]  albuterol (PROVENTIL HFA;VENTOLIN HFA) 108 (90 Base) MCG/ACT inhaler Inhale 1-2 puffs into the lungs every 6 (six) hours as needed for wheezing or shortness of breath. Patient not taking: No sig reported 11/08/17   Marcelyn Bruins, MD  fluticasone [facility]) 50 MCG/ACT nasal spray Place 2 sprays into both nostrils daily. Patient not taking: No sig reported 11/29/20   Bennie Pierini, FNP  fluticasone (FLOVENT HFA) 110 MCG/ACT inhaler Inhale 2 puffs into the lungs 2 (two) times daily. Patient not taking: No sig reported 11/08/17   Marcelyn Bruins, MD  omeprazole (PRILOSEC) 20 MG capsule Take 1 capsule (20 mg total) by mouth daily before  breakfast. Patient not taking: No sig reported 02/12/20   [place], Betty G, MD    Allergies    Blueberry [vaccinium angustifolium] and Theraflu cold & [phenylephrine-pheniramine-dm]  Review of Systems   Review of Systems  All other systems reviewed and are negative.  Physical Exam Updated Vital Signs BP 127/74 (BP Location: Right Arm)   Pulse 75   Temp 98.2 F (36.8 C) (Oral)   Resp 15   LMP 05/10/2021   SpO2 100%   Physical Exam Vitals and nursing note reviewed.  Constitutional:      General: She is not in acute distress.    Appearance: She is well-developed.  HENT:     Head: Atraumatic.     Mouth/Throat:     Mouth: Mucous membranes are moist.  Eyes:     Extraocular Movements: Extraocular movements intact.     Conjunctiva/sclera: Conjunctivae normal.     Pupils: Pupils are equal, round, and reactive to light.  Cardiovascular:     Rate and Rhythm: Normal rate and regular rhythm.     Heart sounds: No murmur heard. Pulmonary:     Effort: Pulmonary effort is normal.     Breath sounds: Normal breath sounds.  Abdominal:     Palpations: Abdomen is soft.     Tenderness: There is no abdominal tenderness.  Musculoskeletal:     Cervical back: Normal range of motion and neck supple.  Skin:    Findings: No rash.  Neurological:     Mental Status: She is alert and oriented to person, place, and time.     GCS: GCS eye subscore is 4. GCS verbal subscore is 5. GCS motor subscore is 6.     Cranial Nerves: Cranial nerves are intact.     Sensory: Sensation is intact.     Motor: Motor function is intact.  Psychiatric:        Mood and Affect: Mood normal.    ED Results / Procedures / Treatments   Labs (all labs ordered are listed, but only abnormal results are displayed) Labs Reviewed  BASIC METABOLIC PANEL - Abnormal; Notable for the following components:      Result Value   Glucose, Bld 112 (*)    All other components within normal limits  CBG MONITORING, ED - Abnormal;  Notable for the following components:   Glucose-Capillary 107 (*)    All other components within normal limits  CBC  URINALYSIS, ROUTINE W REFLEX MICROSCOPIC  I-STAT BETA HCG BLOOD, ED ([facility], [facility], [facility] ONLY)    EKG EKG Interpretation  Date/Time:  Saturday May 20 2021 09:32:00 EDT Ventricular Rate:  71 PR Interval:  142 QRS Duration: 76 QT Interval:  364  QTC Calculation: 395 R Axis:   86 Text Interpretation: Normal sinus rhythm T wave abnormality, consider inferior ischemia T wave abnormality, consider anterior ischemia Abnormal ECG No significant change since last tracing Confirmed by Jacalyn Lefevre [phone]) on 05/20/2021 1:37:38 PM  Radiology No results found.  Procedures Procedures   Medications Ordered in ED Medications  sodium chloride flush (NS) 0.9 % injection 3 mL (has no administration in time range)    ED Course  I have reviewed the triage vital signs and the nursing notes.  Pertinent labs & imaging results that were available during my care of the patient were reviewed by me and considered in my medical decision making (see chart for details).    MDM Rules/Calculators/A&P                          BP (!) 101/59 (BP Location: Left Arm)   Pulse 71   Temp 98.4 F (36.9 C) (Oral)   Resp 17   LMP 05/10/2021   SpO2 100%   Final Clinical Impression(s) / ED Diagnoses Final diagnoses:  Near syncope    Rx / DC Orders ED Discharge Orders     None      1:19 PM Patient here for evaluation of a near syncopal episode that happened earlier today.  Has 1 similar episode several weeks ago with normal work-up.  At this time patient is resting comfortably in no acute discomfort.  Blood pressure is 127/74.  Normal heart rate, lungs clear to auscultation, patient is afebrile.  Pregnancy test negative, CBG currently at 107, electrolyte panels are reassuring, normal renal function, normal WBC and normal H&H, EKG without concerning arrhythmia.  Normal orthostatic vital sign.   Encourage patient to follow-up closely with PCP for outpatient eval.  She may benefit from a cardiac heart monitoring at some point if this is recurrent.  Return precaution given.   Fayrene Helper, PA-C 05/20/21 1455    Jacalyn Lefevre, MD 05/20/21 [phone]

## 2021-05-26 ENCOUNTER — Encounter: Payer: Self-pay | Admitting: Family Medicine

## 2021-05-26 ENCOUNTER — Ambulatory Visit (INDEPENDENT_AMBULATORY_CARE_PROVIDER_SITE_OTHER): Payer: No Typology Code available for payment source | Admitting: Family Medicine

## 2021-05-26 ENCOUNTER — Other Ambulatory Visit: Payer: Self-pay

## 2021-05-26 VITALS — BP 100/70 | HR 73 | Temp 99.2°F | Resp 12 | Ht 60.0 in | Wt 98.0 lb

## 2021-05-26 DIAGNOSIS — R55 Syncope and collapse: Secondary | ICD-10-CM

## 2021-05-26 DIAGNOSIS — R002 Palpitations: Secondary | ICD-10-CM | POA: Diagnosis not present

## 2021-05-26 NOTE — Patient Instructions (Signed)
A few things to remember from today's visit:  Palpitation - Plan: Ambulatory referral to Cardiology  Near syncope - Plan: Ambulatory referral to Cardiology  If you need refills please call your pharmacy. Do not use My Chart to request refills or for acute issues that need immediate attention.   Adequate hydration,move slowly,and sprinkle small amount salt on food. Please be sure medication list is accurate. If a new problem present, please set up appointment sooner than planned today.

## 2021-05-26 NOTE — Progress Notes (Signed)
HPI: KRISTIANE MORSCH is a 25 y.o. female with hx of asthma and anxiety here today to follow on recent ER visit. Problem happened while she was working, taking pt's VS's when she felt like she was going to pass out. According to pt, HR and BP were low. She does not remember HR. BP was 75/54. Felt heat on her face and lips numbness for 5 min. Negative for diplopia,headache,CP,SOB,N/V,or focal neurologic weakness. No new stressors. She is eating better, not skipping meals.  Lying down with a washcloth on her forehead helped. She was walked to the ER, next door.  2 weeks prior to episode she had similar episode for a few seconds. She has not identified exacerbating or alleviating factors.  Rapid heart beat sometimes with mild exertion, low 90's. 9 years old cousin with heart condition, not sure about congenital heart disease.   She is concerned about EKG in the ER, she was told that her EKG was abnormal and suggested having a heart monitor. She has had a few EKG's since 2017 because CP and presyncope.  Lab Results  Component Value Date   CREATININE 0.80 05/20/2021   BUN 7 05/20/2021   NA 136 05/20/2021   K 3.5 05/20/2021   CL 105 05/20/2021   CO2 25 05/20/2021   Lab Results  Component Value Date   WBC 4.6 05/20/2021   HGB 12.6 05/20/2021   HCT 39.4 05/20/2021   MCV 90.8 05/20/2021   PLT 271 05/20/2021   Review of Systems  Constitutional:  Negative for activity change, appetite change, fatigue and fever.  HENT:  Negative for nosebleeds and sore throat.   Respiratory:  Negative for cough and wheezing.   Cardiovascular:  Negative for leg swelling.  Gastrointestinal:  Negative for abdominal pain.       Negative for changes in bowel habits.  Endocrine: Negative for cold intolerance, heat intolerance, polydipsia, polyphagia and polyuria.  Genitourinary:  Negative for decreased urine volume, dysuria and hematuria.  Musculoskeletal:  Negative for gait problem and  myalgias.  Allergic/Immunologic: Positive for environmental allergies.  Neurological:  Negative for seizures, facial asymmetry and weakness.  Psychiatric/Behavioral:  Negative for confusion. The patient is nervous/anxious.   Rest see pertinent positives and negatives per HPI.  Current Outpatient Medications on File Prior to Visit  Medication Sig Dispense Refill   mirtazapine (REMERON) 7.5 MG tablet Take 1 tablet (7.5 mg total) by mouth at bedtime. 90 tablet 1   SPRINTEC 28 0.25-35 MG-MCG tablet Take 1 tablet by mouth daily.  2   No current facility-administered medications on file prior to visit.   Past Medical History:  Diagnosis Date   Allergy    Anxiety    Asthma    Allergies  Allergen Reactions   Blueberry [Vaccinium Angustifolium]    Theraflu Cold & [Phenylephrine-Pheniramine-Dm]     ?sob/vomitin    Social History   Socioeconomic History   Marital status: Single    Spouse name: Not on file   Number of children: Not on file   Years of education: Not on file   Highest education level: Not on file  Occupational History   Not on file  Tobacco Use   Smoking status: Never   Smokeless tobacco: Never  Vaping Use   Vaping Use: Never used  Substance and Sexual Activity   Alcohol use: No   Drug use: No   Sexual activity: Not on file  Other Topics Concern   Not on file  Social History  Narrative   Not on file   Social Determinants of Health   Financial Resource Strain: Not on file  Food Insecurity: Not on file  Transportation Needs: Not on file  Physical Activity: Not on file  Stress: Not on file  Social Connections: Not on file   Vitals:   05/26/21 0959  BP: 100/70  Pulse: 73  Resp: 12  Temp: 99.2 F (37.3 C)  SpO2: 99%   Body mass index is 19.14 kg/m.  Physical Exam Vitals and nursing note reviewed.  Constitutional:      General: She is not in acute distress.    Appearance: She is well-developed.  HENT:     Head: Normocephalic and atraumatic.      Mouth/Throat:     Mouth: Mucous membranes are moist.     Pharynx: Oropharynx is clear.  Eyes:     Conjunctiva/sclera: Conjunctivae normal.  Cardiovascular:     Rate and Rhythm: Normal rate and regular rhythm.     Pulses:          Dorsalis pedis pulses are 2+ on the right side and 2+ on the left side.     Heart sounds: No murmur heard. Pulmonary:     Effort: Pulmonary effort is normal. No respiratory distress.     Breath sounds: Normal breath sounds.  Abdominal:     Palpations: Abdomen is soft. There is no hepatomegaly or mass.     Tenderness: There is no abdominal tenderness.  Lymphadenopathy:     Cervical: No cervical adenopathy.  Skin:    General: Skin is warm.     Findings: No erythema or rash.  Neurological:     General: No focal deficit present.     Mental Status: She is alert and oriented to person, place, and time.     Cranial Nerves: No cranial nerve deficit.     Gait: Gait normal.  Psychiatric:     Comments: Well groomed, good eye contact.   ASSESSMENT AND PLAN:  Eliese was seen today for hospitalization follow-up.  Diagnoses and all orders for this visit: Orders Placed This Encounter  Procedures   Ambulatory referral to Cardiology    Palpitation We discussed possible etiologies. Anxiety can be a contributing factor. Problem has been intermittently for a while. We reviewed EKG's sine 2017 and she has unspecific T wave abnormalities,new in 2018.Sinus arrhythmia, normal axis. Instructed about warning signs. Will benefit from cardiac monitor, may need echo. Cardio referral placed.  Near syncope She has had a few episodes through the years, 2 in the past 2 weeks. Continue adequate hydration. Instructed about warning signs.   Return if symptoms worsen or fail to improve, for Keep next appt..   Octa Uplinger G. [place], MD  [facility] [place] office.

## 2021-06-27 ENCOUNTER — Ambulatory Visit: Payer: PRIVATE HEALTH INSURANCE | Admitting: Family Medicine

## 2021-08-01 ENCOUNTER — Encounter: Payer: Self-pay | Admitting: Family Medicine

## 2021-08-01 ENCOUNTER — Ambulatory Visit: Payer: PRIVATE HEALTH INSURANCE | Admitting: Family Medicine

## 2021-08-01 VITALS — BP 102/68 | HR 77 | Ht 60.0 in | Wt 98.0 lb

## 2021-08-01 DIAGNOSIS — Z789 Other specified health status: Secondary | ICD-10-CM

## 2021-08-01 DIAGNOSIS — J302 Other seasonal allergic rhinitis: Secondary | ICD-10-CM

## 2021-08-01 MED ORDER — SPRINTEC 28 0.25-35 MG-MCG PO TABS: 1.0000 | ORAL_TABLET | Freq: Every day | ORAL | 11 refills | Status: DC

## 2021-08-01 NOTE — Progress Notes (Signed)
Subjective:     Patient ID: Mariah Haas, female   DOB: 06-Nov-1996, 25 y.o.   MRN: [id number]  HPI Mariah Haas [facility] today for her required wellness visit for her insurance. Her PCP is Dr. [place] who she sees regularly. She reports pap smear was done approx 2 years ago and normal. She reports having issues with nasal congestion d/t seasonal allergies. Has tried taking all the otc non-drowsy antihistamines with no relief. Benadryl helps but can only take that at night. She is also requesting a refill of her birth control, she states she has been on it for 4-5 years without problem, has been out the last couple weeks d/t problem with her insurance, but states this should be fixed now.   Past Medical History:  Diagnosis Date   Allergy    Anxiety    Asthma    Allergies  Allergen Reactions   Blueberry [Vaccinium Angustifolium]    Theraflu Cold & [Phenylephrine-Pheniramine-Dm]     ?sob/vomitin    Current Outpatient Medications:    mirtazapine (REMERON) 7.5 MG tablet, Take 1 tablet (7.5 mg total) by mouth at bedtime. (Patient not taking: Reported on 08/01/2021), Disp: 90 tablet, Rfl: 1   SPRINTEC 28 0.25-35 MG-MCG tablet, Take 1 tablet by mouth daily., Disp: 28 tablet, Rfl: 11   Review of Systems  Constitutional:  Negative for chills, fatigue, fever and unexpected weight change.  HENT:  Positive for congestion. Negative for ear pain, sinus pressure, sinus pain and sore throat.   Eyes:  Negative for discharge and visual disturbance.  Respiratory:  Negative for cough, shortness of breath and wheezing.   Cardiovascular:  Negative for chest pain and leg swelling.  Gastrointestinal:  Negative for abdominal pain, blood in stool, constipation, diarrhea, nausea and vomiting.  Genitourinary:  Negative for difficulty urinating and hematuria.  Skin:  Negative for color change.  Neurological:  Negative for dizziness, weakness, light-headedness and headaches.   Hematological:  Negative for adenopathy.  All other systems reviewed and are negative.     Objective:   Physical Exam Vitals reviewed.  Constitutional:      General: She is not in acute distress.    Appearance: Normal appearance. She is well-developed.  HENT:     Head: Normocephalic and atraumatic.  Eyes:     General:        Right eye: No discharge.        Left eye: No discharge.  Cardiovascular:     Rate and Rhythm: Normal rate and regular rhythm.     Heart sounds: Normal heart sounds.  Pulmonary:     Effort: Pulmonary effort is normal. No respiratory distress.     Breath sounds: Normal breath sounds.  Musculoskeletal:     Cervical back: Neck supple.  Skin:    General: Skin is warm and dry.  Neurological:     Mental Status: She is alert and oriented to person, place, and time.  Psychiatric:        Mood and Affect: Mood normal.        Behavior: Behavior normal.   Today's Vitals   08/01/21 1436  BP: 102/68  Pulse: 77  SpO2: 99%  Weight: 98 lb (44.5 kg)  Height: 5' (1.524 m)   Body mass index is 19.14 kg/m.     Assessment:     Participant in health and wellness plan  Seasonal and perennial allergic rhinitis     Plan:     Keep all regular  appts with PCP and specialists. Encouraged healthy eating and exercise as able.  Recommend trial of flonase, 2 sprays each nostril daily, see if this helps relieve her allergy sx.  OCP refilled. Continue using condoms for STI protection and as back up birth control for at least the first two weeks.  F/u here prn.

## 2021-08-10 ENCOUNTER — Encounter: Payer: Self-pay | Admitting: Internal Medicine

## 2021-08-10 ENCOUNTER — Ambulatory Visit: Payer: No Typology Code available for payment source | Admitting: Internal Medicine

## 2021-08-10 ENCOUNTER — Other Ambulatory Visit: Payer: Self-pay

## 2021-08-10 VITALS — BP 112/74 | HR 65 | Ht 60.0 in | Wt 89.8 lb

## 2021-08-10 DIAGNOSIS — R55 Syncope and collapse: Secondary | ICD-10-CM | POA: Diagnosis not present

## 2021-08-10 DIAGNOSIS — R002 Palpitations: Secondary | ICD-10-CM | POA: Diagnosis not present

## 2021-08-10 NOTE — Progress Notes (Signed)
Cardiology Office Note   Date:  08/10/2021   ID:  Mariah Haas, DOB December 24, 1995, MRN [id number]  PCP:  [place], Betty G, MD  Cardiologist:   Dietrich Pates, MD   Pt referred from ED for eval of dizziness   History of Present Illness: Mariah Haas is a 25 y.o. female with a history of asthma and anxiety    Seen in ED in on 05/20/21   The pt was at work   Got dizzy, blurred vision.  Sat down   BP 75/54   Pulse slow    Pt says she had some gatorade and food that AM     Seen in ED    Episode resolved   No recurrence     Pt denies viral infection prior    Pt active Breathing is OK   Notes her heart beating in the 90s at times   Not faster   No dizziness    Says she drinks fluids   Urine can be darker     Current Meds  Medication Sig   SPRINTEC 28 0.25-35 MG-MCG tablet Take 1 tablet by mouth daily.     Allergies:   Blueberry [vaccinium angustifolium] and Theraflu cold & [phenylephrine-pheniramine-dm]   Past Medical History:  Diagnosis Date   Allergy    Anxiety    Asthma     Past Surgical History:  Procedure Laterality Date   NO PAST SURGERIES       Social History:  The patient  reports that she has never smoked. She has never used smokeless tobacco. She reports that she does not drink alcohol and does not use drugs.   Family History:  The patient's family history includes Anxiety disorder in her maternal aunt and mother; Bipolar disorder in her maternal aunt, maternal grandfather, and mother; Cancer in her maternal aunt and paternal uncle; Diabetes in her mother; Headache in her mother; Lung cancer in her maternal grandmother; Migraines in her maternal aunt, maternal grandfather, and mother; Seizures in her maternal aunt and mother.    ROS:  Please see the history of present illness. All other systems are reviewed and  Negative to the above problem except as noted.    PHYSICAL EXAM: VS:  BP 112/74   Pulse 65   Ht 5' (1.524 m)   Wt 89 lb 12.8 oz (40.7 kg)    SpO2 99%   BMI 17.54 kg/m   GEN: Thin 25 yo in no acute distress  HEENT: normal  Neck: no JVD, carotid bruits, Cardiac: RRR; no murmurs,  No LE edema  Respiratory:  clear to auscultation bilaterally GI: soft, nontender, nondistended, + BS  No hepatomegaly  MS: no deformity Moving all extremities   Skin: warm and dry, no rash Neuro:  Strength and sensation are intact Psych: euthymic mood, full affect   EKG:  EKG is ordered today.  SR 65 bpm   NOnspecific ST changes    Lipid Panel No results found for: CHOL, TRIG, HDL, CHOLHDL, VLDL, LDLCALC, LDLDIRECT    Wt Readings from Last 3 Encounters:  08/10/21 89 lb 12.8 oz (40.7 kg)  08/01/21 98 lb (44.5 kg)  05/26/21 98 lb (44.5 kg)      ASSESSMENT AND PLAN:  1  Dizziness/ presyncope   Pt had 1 signficiant spell   SHort lived  Sounds somewhat vagal,  and she may have not been adequately hydrated     No recurrence    Stay hydrated   Increase hydration  Stay active  2  Palpitations    HR goes to 90s at times   I do not think it is an arrhythmia   Stay hydrated      F/U as needed      Current medicines are reviewed at length with the patient today.  The patient does not have concerns regarding medicines.  Signed, Dietrich Pates, MD  08/10/2021 1:58 PM    [facility] [address] [place], [place], [place]  [address] Phone: [phone]; Fax: [phone]

## 2021-08-10 NOTE — Patient Instructions (Signed)
Medication Instructions:  Your physician recommends that you continue on your current medications as directed. Please refer to the Current Medication list given to you today.  *If you need a refill on your cardiac medications before your next appointment, please call your pharmacy*   Lab Work: none If you have labs (blood work) drawn today and your tests are completely normal, you will receive your results only by: MyChart Message (if you have MyChart) OR A paper copy in the mail If you have any lab test that is abnormal or we need to change your treatment, we will call you to review the results.   Testing/Procedures: none   Follow-Up: At [facility], you and your health needs are our priority.  As part of our continuing mission to provide you with exceptional heart care, we have created designated Provider Care Teams.  These Care Teams include your primary Cardiologist (physician) and Advanced Practice Providers (APPs -  Physician Assistants and Nurse Practitioners) who all work together to provide you with the care you need, when you need it.  We recommend signing up for the patient portal called "MyChart".  Sign up information is provided on this After Visit Summary.  MyChart is used to connect with patients for Virtual Visits (Telemedicine).  Patients are able to view lab/test results, encounter notes, upcoming appointments, etc.  Non-urgent messages can be sent to your provider as well.   To learn more about what you can do with MyChart, go to [email or web address].    Your next appointment:   As needed  The format for your next appointment:   In Person  Provider:   You may see Dietrich Pates, MD or one of the following Advanced Practice Providers on your designated Care Team:   Tereso Newcomer, PA-C Chelsea Aus, [place]   Other Instructions

## 2021-09-04 ENCOUNTER — Ambulatory Visit: Payer: PRIVATE HEALTH INSURANCE | Admitting: Family Medicine

## 2021-10-03 ENCOUNTER — Telehealth (INDEPENDENT_AMBULATORY_CARE_PROVIDER_SITE_OTHER): Payer: PRIVATE HEALTH INSURANCE

## 2021-10-03 DIAGNOSIS — Z3480 Encounter for supervision of other normal pregnancy, unspecified trimester: Secondary | ICD-10-CM

## 2021-10-03 DIAGNOSIS — Z3A00 Weeks of gestation of pregnancy not specified: Secondary | ICD-10-CM

## 2021-10-03 DIAGNOSIS — Z136 Encounter for screening for cardiovascular disorders: Secondary | ICD-10-CM

## 2021-10-03 MED ORDER — BLOOD PRESSURE MONITORING DEVI: 1.0000 | 0 refills | Status: DC

## 2021-10-03 NOTE — Progress Notes (Signed)
New OB Intake  I connected with  Mariah Haas on 10/03/21 at 10:15 AM EST by MyChart Video Visit and verified that I am speaking with the correct person using two identifiers. Nurse is located at [facility] and pt is located at HOME.  I discussed the limitations, risks, security and privacy concerns of performing an evaluation and management service by telephone and the availability of in person appointments. I also discussed with the patient that there may be a patient responsible charge related to this service. The patient expressed understanding and agreed to proceed.  I explained I am completing New OB Intake today. We discussed her EDD of 05/12/2022 that is based on LMP of 08/05/2021. Pt is G1/P0. I reviewed her allergies, medications, Medical/Surgical/OB history, and appropriate screenings. I informed her of [facility] services. Based on history, this is a/an  pregnancy uncomplicated .   Patient Active Problem List   Diagnosis Date Noted   Supervision of other normal pregnancy, antepartum 10/03/2021   Irritable bowel syndrome with constipation 04/22/2019   Mild intermittent asthma, uncomplicated 02/06/2018   Seasonal and perennial allergic rhinitis 02/06/2018   Cyst of ovary 12/16/2017   Allergic rhinitis due to allergen 03/18/2017   Migraine without aura and without status migrainosus, not intractable 01/28/2014    Concerns addressed today  Delivery Plans:  Plans to deliver at [facility].   MyChart/Babyscripts MyChart access verified. I explained pt will have some visits in office and some virtually. Babyscripts instructions given and order placed. Patient verifies receipt of registration text/e-mail. Account successfully created and app downloaded.  Blood Pressure Cuff  Blood pressure cuff ordered for patient to pick-up from [employer]. Explained after first prenatal appt pt will check weekly and document in Babyscripts.  Weight scale: Patient    have weight scale. Weight scale  ordered for patient to pick up form [facility].   Anatomy US Explained first scheduled [place] will be around 19 weeks. Anatomy US scheduled for 12/18/2021 at 02:15p. Pt notified to arrive at 02:00p.  Labs Discussed Avelina Laine genetic screening with patient. Would like both Panorama and Horizon drawn at new OB visit. Routine prenatal labs needed.  Covid Vaccine Patient has covid vaccine.   Centering in Pregnancy Candidate?  If yes, offer as possibility  Mother/ Baby Dyad Candidate?    If yes, offer as possibility  Informed patient of [facility] website  and placed link in her AVS.   Social Determinants of Health Food Insecurity: Patient denies food insecurity. WIC Referral: Patient is interested in referral to [facility].  Transportation: Patient denies transportation needs. Childcare: Discussed no children allowed at ultrasound appointments. Offered childcare services; patient declines childcare services at this time.  Send link to Pregnancy Navigators   Placed OB Box on problem list and updated  First visit review I reviewed new OB appt with pt. I explained she will have a pelvic exam, ob bloodwork with genetic screening, and PAP smear. Explained pt will be seen by Dr. Merian [place] at first visit; encounter routed to appropriate provider. Explained that patient will be seen by pregnancy navigator following visit with provider. Navos information placed in AVS.   Henrietta Dine, CMA 10/03/2021  11:18 AM

## 2021-10-03 NOTE — Patient Instructions (Signed)
AREA PEDIATRIC/FAMILY PRACTICE PHYSICIANS  [place] ([address]) [facility] Chambliss, MD; Eniola, MD; Hale, MD; Hensel, MD; McDiarmid, MD; McIntyer, MD; Kathy Wares, MD; Walden, MD [address] [place], [place] [address] [phone] Mon-Fri 8:30-12:30, 1:30-5:00 Providers come to see babies at [facility] Accepting Medicaid [facility] providers who accept newborns: Koirala, MD; Morrow, MD; Wolters, MD [address], [place], [place] [address] [phone] Mon-Fri 8:00-5:30 Babies seen by providers at [facility] Does NOT accept Medicaid Please call early in hospitalization for appointment (limited availability)  [facility] Mulberry, MD [address] [place], [place] [address] [phone] Mon, Tue, Thur, Fri 8:30-5:00, Wed 10:00-7:00 (closed 1-2pm) Babies seen by [facility] providers Accepting Medicaid Rubin - Pediatrician Rubin, MD [address], [place], [place] [address] [phone] Mon-Fri 8:30-5:00, Sat 8:30-12:00 Provider comes to see babies at [facility] Accepting Medicaid Must have been referred from current patients or contacted office prior to delivery [facility] ([facility]) Brown, MD; Chandler, MD; Ettefagh, MD; Grant, MD; Lester, MD; McCormick, MD; McQueen, MD; Prose, MD; Simha, MD; Stanley, MD; Stryffeler, NP; Tebben, NP [address], [place], [place] [address] [phone] Mon, Tue, Thur, Fri 8:30-5:30, Wed 9:30-5:30, Sat 8:30-12:30 Babies seen by [facility] providers Accepting Medicaid Only accepting infants of first-time parents or siblings of current patients Hospital discharge coordinator will make follow-up appointment Jack Amos [address], [place], [place]  [address] [phone]   Fax - [phone] [facility] [address], [place], [place]  [address] Phone - [phone]   Fax - [phone] Shilpa Gosrani [address], [place], [place]  [address] [phone]  [place] ([address]) [facility] Bates, MD; Brassfield, MD; Cooper, Cox, MD; MD; Davis, MD; Dovico, MD; Ettefaugh, MD; Little, MD; Lowe, MD; Keiffer, MD; Melvin, MD; Sumner, MD; Williams, MD [address], [place], [place] [address] [phone] Mon-Fri 8:30-5:00 (extended evenings Mon-Thur as needed), Sat-Sun 10:00-1:00 Providers come to see babies at [facility] Accepting Medicaid for families of first-time babies and families with all children in the household age 3 and under. Must register with office prior to making appointment (M-F only). [facility] Henson, NP; Knapp, MD; Lalonde, MD; Tysinger, PA [address] [place], [place] [address] [phone] Mon-Fri 8:00-5:00 Babies seen by providers at [facility] Does NOT accept Medicaid/Commercial Insurance Only [facility] ([facility])  Artis, MD; Barnes, MD; Bratton, MD; Coccaro, MD; Lockett Gardner, MD; Kramer, MD; Marshall, MD; Netherton, MD; Poleto, MD; Skinner, MD [address] [place], [place] [address] [phone] Mon-Fri 8:30-5:30, Sat (Oct.-Mar.) 9:00-1:00 Babies seen by providers at [facility] Accepting Medicaid  [place] ([address]) [facility] Reid, MD; Warner, MD [address], [place], [place] [address] [phone] Mon-Fri 8:30-5:00, Sat 8:30-12:00 Providers come to see babies at [facility] Does NOT accept Medicaid [facility] Becker, PA; Hagler, MD; Scifres, PA; Sun, MD; Swayne, MD [address], [place], [place] [address] [phone] Mon-Fri 8:00-5:00 Babies seen by providers at [facility] Does NOT accept Medicaid Only accepting babies of parents who  are patients Please call early in hospitalization for appointment (limited availability)  Pediatricians Clark, MD; Frye, MD; Kelleher, MD; Mack, NP; Miller, MD; O'Keller, MD; Patterson, NP; Pudlo, MD; Puzio, MD; Thomas, MD; Tucker, MD; Twiselton, MD [address], [place], [place] [address] [phone] Mon-Fri 8:00-5:00, Sat 9:00-12:00 Providers come to see babies at [facility] Does NOT accept Medicaid  [place] ([address]) [facility] providers accepting new patients: Brake, NP; Wharton, PA [address], [place], [place] [address] [phone] Mon-Fri 8:00-5:00 Babies seen by providers at [facility] Does NOT accept Medicaid Only accepting babies of parents who are patients Please call early in hospitalization for appointment (limited availability) [facility] Gay, MD; Quinlan, MD [address] [place], [place] [address] [phone] (press 1 to schedule appointment) Mon-Fri 8:00-5:00 Providers come to see babies at [facility] Does NOT accept Medicaid [facility] Mazer, MD [address] [place], [place] [address] [phone] Mon-Fri 8:30-5:00 (lunch 12:30-1:00), extended hours by appointment only Wed 5:00-6:30 Babies seen by [facility] providers Accepting Medicaid [facility] Banks, MD; Jordan, MD; Koberlein, MD [address], [place], [place] [address] [phone] Mon-Fri 8:00-5:00 Babies seen by [facility] providers Does NOT accept Medicaid [facility] Parker, MD; Hunter, MD; Wallace, DO [address] [place], [place] [address] [phone] Mon-Fri 8:00-5:00 Babies seen by [facility] providers Does NOT accept Medicaid [facility] Brandon, PA; Brecken, PA; Christy, NP; Dees, MD; DeClaire, MD; DeWeese, MD; Hansen, NP; Mills, NP; Parrish, NP; Smoot, NP; Summer, MD; Vapne,  MD [address] [place], [place] [address] [phone] Mon-Fri 8:30-5:00, Sat 10:00-1:00 Providers come to see babies at [facility] Does NOT accept Medicaid Free prenatal information session Tuesdays at 4:45pm [facility] Bouska, MD; Gordon, PA; Jeffery, PA; Weber, PA [address] [place] [address] [phone] Mon-Fri 7:30-5:30 Babies seen by [facility] providers [facility] [address], [place], [place]  [address] [phone]   Fax - [phone]  [place] ([address] & [address]) [facility] [place] [address] [place], [place] [address] [phone] Mon-Thur 8:00-6:00 Providers come to see babies at [facility] Accepting Medicaid [facility] Anderson, NP; Badger, MD; Beal, PA; Spencer, PA [address] [place], [place] [address] [phone] Mon-Thur 7:30-7:30, Fri 7:30-4:30 Babies seen by [facility] providers Accepting Medicaid [facility] Agbuya, MD; Klett, NP; Romgoolam, MD [address], [place], [place] [address] [phone] Mon-Fri 8:30-5:00, Sat 8:30-12:00 Providers come to see babies at [facility] Accepting Medicaid Must have "Meet & Greet" appointment at office prior to delivery [facility] ([facility]) McCord, MD; Wallace, MD; Wood, MD [address], [place], [place] [address] [phone] Mon-Wed 8:00-6:00, Thur-Fri 8:00-5:00, Sat 9:00-12:00 Providers come to see babies at [facility] Does NOT accept Medicaid Only accepting siblings of current patients [facility]  [address], [place], [place]  [address] [phone]   Fax - [phone] [facility] [address], [place], [place]  [address] [phone]   Fax -  [phone]  [place] ([address] & [address]) [facility] Cirigliano, DO; Matthews, DO [address] [place], [place] [address] [phone] Mon-Fri 7:00-5:00 Babies seen by [facility] providers Does NOT accept Medicaid [facility] Briscoe, MD; Howley, PA; Moreira, PA [address], [place],  [address] [phone] Mon-Fri 8:00-5:00 Babies seen by [facility] providers Accepting Medicaid [facility] Boyd, MD; Church, PA; Jones, NP; Osborn, PA [address], ,  [address] [phone] Mon-Fri 8:00-5:00 Babies seen by providers at [facility] Accepting Medicaid  [place] ([address]) [facility] [place] Wendling, DO [address] [place], [place] [address] [phone] Mon-Fri 8:00-5:00 Babies seen by [facility] providers Does NOT accept Medicaid Limited availability, please call early in hospitalization to schedule follow-up [facility] Calderon, PA; Cummings, MD; Dillard, MD; Martin, PA; Olson, MD; VanDeven, PA [address], [place], [place] [address] [phone] Mon-Fri 8:30-5:00, Sat 9:00-12:00 Babies seen by providers at [facility] Accepting Medicaid Please register online then schedule online or call office [email or web address] Hunter, NP; Kumar, MD; Martin Rogers, PA [address], [place], [place] [address] [phone] Mon-Fri 8:00-5:00 Babies seen by providers at [facility] Accepting Medicaid [facility] ([facility]) Frankclay, MD; Kristi Fleenor, NP; West, MD [address], [place], [place] [address] [phone] Mon-Fri 8:00-5:30, Sat&Sun by appointment (phones open at  8:30) Babies seen by [facility] providers Accepting Medicaid Must be a first-time baby or sibling of current patient [facility] [place]  [address], [place], [place]  [address] [phone]   Fax - [phone]  [place] ([address]) [facility] Brown, PA; Cowen, PA; Rice, MD; Helton, PA; Spry, MD [address] [place], [place] [address] [phone] Mon-Thur 8:00-7:00, Fri 8:00-5:00, Sat 8:00-12:00, Sun 9:00-12:00 Babies seen by [facility] providers Accepting Medicaid [facility] Coe-Goins, MD; Marshall, MD; Pierre-Louis, MD [address], [place], [place] [address] [phone] Mon-Thur 8:00-5:00 Babies seen by providers at [facility] Accepting Medicaid [facility] Bratton, MD; Coe-Goins, MD; Hayes, MD; Lewis, MD; List, MD; Lott, MD; Marshall, MD; Moran, MD; O'Rashunda Passon, MD; Pierre-Louis, MD; Pitonzo, MD; Scholer, MD; Spangle, MD [address] [place], [place] [address] [phone] Mon-Fri 8:00-5:30, Sat (Oct.-Mar.) 9:00-1:00 Babies seen by providers at [facility] Accepting Medicaid Must fill out new patient packet, available online at [email or web address] [facility] ([facility]) Friddle, NP; Harris, NP; Kelly, NP; Logan, MD; Melvin, PA; Poth, MD; Ramadoss, MD; Stanton, NP [address], [place], [place] [address] [phone] Mon-Thur 8:00-5:30, Fri 8:00-5:00 Babies seen by providers at [facility] Accepting Medicaid  [place] ([address]) [facility] Dixon, PA; Baring, MD; Pickard, MD; Tapia, PA [address], [place], [place] [address] [phone] Mon-Fri 8:00-5:00 Babies seen by providers at [facility] Accepting Medicaid   [place] ([address]) [facility] [place] Masneri, DO; Meyers, MD; Nelson, PA [address], [place], [place] [address] [phone] Mon-Fri 8:00-5:00 Babies seen by providers at [facility] Does NOT accept Medicaid Limited appointment availability, please call early in hospitalization  [facility] [place] Kunedd, DO; McGowen, MD [address], [place], [place] [address] [phone] Mon-Fri 8:00-5:00 Babies seen by [facility] providers Does NOT accept Medicaid [facility] [place] Cameron, MD; MacDonald, MD; Michaels, PA; Nayak, MD [address] [place] [address], [place], [place] [address] [phone] Mon-Fri 8:00-5:00 After hours clinic ([address] [place], [place] [address]) [phone] Mon-Fri 5:00-8:00, Sat 12:00-6:00, Sun 10:00-4:00 Babies seen by [facility] providers Accepting Medicaid [facility] [place] [address], [place], [place]  [address] [phone]   Fax - [phone]  [place] ([address]) [facility] [place] [facility] Andy, MD [address], [place], [place] [address] [phone] Mon-Fri 8:00-5:00 Babies seen by [facility] providers Does NOT accept Medicaid [facility] [place] ([facility] [place]) Eksir, MD [address], [place], [place] [address] [phone] Mon-Thur 8:00-7:00, Fri 8:00-5:00, Sat 8:00-12:00 Babies seen by providers at [facility] Accepting Medicaid - but does not have vaccinations in office (must be received elsewhere) Limited availability, please call early in hospitalization  [place] ([address]) [facility]  Charlene Flemming, MD [address],  [place] [address] [phone]  Fax [phone]  [facility]  Kimberly Newton, MD, Annamarie Streilein, PA, Carla Hampton, PA [address] [place], [place]  [address] [phone] [facility]  [address], [place], [place] [address] [phone] [address], [place], [place] [address] [phone] (West Office)  [facility] [address], [place], [place] [address] [phone] [facility] [address], [place], [place] [address] [phone] [facility] [address], [place], [place] [address] [phone] [facility] [address], [place], [place] [address] [phone] [facility] [address], [place], [place] [address] [phone] [facility] [address], [place], [place] [address] [phone] [facility]  [address], [place], [place] [address] [phone] Dr. [facility] [address] [place], [place], [place] [address] [phone] [facility] [address], [place], [place] [address] [phone] [facility] [address], [place],  [address] [phone]   At our [facility], we work as an integrated team, providing care to address both physical and emotional health. Your medical provider may refer you to see our Behavioral Health Clinician (BHC) on the same day you see your medical provider, as availability permits; often scheduled virtually at your convenience.  Our BHC is available to all patients, visits generally last between 20-30 minutes, but can be longer or shorter, depending on patient need. The BHC offers help with stress management, coping with symptoms of depression and anxiety, major life changes , sleep issues, changing risky behavior, grief and loss, life stress, working on personal life goals, and  behavioral health issues, as these all affect your overall health and wellness.  The BHC is NOT available for the following: FMLA paperwork, court-ordered evaluations, specialty assessments (custody or disability), letters to employers, or  obtaining certification for an emotional support animal. The BHC does not provide long-term therapy. You have the right to refuse integrated behavioral health services, or to reschedule to see the BHC at a later date.  Confidentiality exception: If it is suspected that a child or disabled adult is being abused or neglected, we are required by law to report that to either Child Protective Services or Adult Protective Services.  If you have a diagnosis of Bipolar affective disorder, Schizophrenia, or recurrent Major depressive disorder, we will recommend that you establish care with a psychiatrist, as these are lifelong, chronic conditions, and we want your overall emotional health and medications to be more closely monitored. If you anticipate needing extended maternity leave due to mental health issues postpartum, it it recommended you inform your medical provider, so we can put in a referral to a psychiatrist as soon as possible. The BHC is unable to recommend an extended maternity leave for mental health issues. Your medical provider   or BHC may refer you to a therapist for ongoing, traditional therapy, or to a psychiatrist, for medication management, if it would benefit your overall health. Depending on your insurance, you may have a copay or be charged a deductible, depending on your insurance, to see the BHC. If you are uninsured, it is recommended that you apply for financial assistance. (Forms may be requested at the front desk for in-person visits, via MyChart, or request a form during a virtual visit).  If you see the BHC more than 6 times, you will have to complete a comprehensive clinical assessment interview with the BHC to resume integrated services.  For virtual visits with the BHC, you must be physically in the state of [place] at the time of the visit. For example, if you live in [place], you will have to do an in-person visit with the BHC, and your out-of-state insurance may not cover  behavioral health services in [place]. If you are going out of the state or country for any reason, the BHC may see you virtually when you return to [place], but not while you are physically outside of .   

## 2021-10-14 ENCOUNTER — Other Ambulatory Visit: Payer: Self-pay

## 2021-10-14 ENCOUNTER — Encounter (HOSPITAL_COMMUNITY): Payer: Self-pay | Admitting: Obstetrics & Gynecology

## 2021-10-14 ENCOUNTER — Inpatient Hospital Stay (HOSPITAL_COMMUNITY)
Admission: AD | Admit: 2021-10-14 | Discharge: 2021-10-14 | Disposition: A | Discharge status: Home / Self Care | Payer: No Typology Code available for payment source | Attending: Obstetrics & Gynecology | Admitting: Obstetrics & Gynecology

## 2021-10-14 DIAGNOSIS — R55 Syncope and collapse: Secondary | ICD-10-CM | POA: Diagnosis not present

## 2021-10-14 DIAGNOSIS — R109 Unspecified abdominal pain: Secondary | ICD-10-CM | POA: Diagnosis present

## 2021-10-14 DIAGNOSIS — R531 Weakness: Secondary | ICD-10-CM | POA: Insufficient documentation

## 2021-10-14 DIAGNOSIS — Z3491 Encounter for supervision of normal pregnancy, unspecified, first trimester: Secondary | ICD-10-CM

## 2021-10-14 DIAGNOSIS — R1012 Left upper quadrant pain: Secondary | ICD-10-CM | POA: Diagnosis not present

## 2021-10-14 DIAGNOSIS — Z3A10 10 weeks gestation of pregnancy: Secondary | ICD-10-CM | POA: Diagnosis not present

## 2021-10-14 DIAGNOSIS — O26891 Other specified pregnancy related conditions, first trimester: Secondary | ICD-10-CM | POA: Insufficient documentation

## 2021-10-14 DIAGNOSIS — O99891 Other specified diseases and conditions complicating pregnancy: Secondary | ICD-10-CM | POA: Diagnosis not present

## 2021-10-14 LAB — COMPREHENSIVE METABOLIC PANEL
ALT: 24 U/L (ref 0–44)
AST: 18 U/L (ref 15–41)
Albumin: 3.6 g/dL (ref 3.5–5.0)
Alkaline Phosphatase: 32 U/L — ABNORMAL LOW (ref 38–126)
Anion gap: 6 (ref 5–15)
BUN: 8 mg/dL (ref 6–20)
CO2: 23 mmol/L (ref 22–32)
Calcium: 9.1 mg/dL (ref 8.9–10.3)
Chloride: 104 mmol/L (ref 98–111)
Creatinine, Ser: 0.62 mg/dL (ref 0.44–1.00)
GFR, Estimated: >60 mL/min (ref >60)
Glucose, Bld: 90 mg/dL (ref 70–99)
Potassium: 3.6 mmol/L (ref 3.5–5.1)
Sodium: 133 mmol/L — ABNORMAL LOW (ref 135–145)
Total Bilirubin: 0.2 mg/dL — ABNORMAL LOW (ref 0.3–1.2)
Total Protein: 6.6 g/dL (ref 6.5–8.1)

## 2021-10-14 LAB — CBC
HCT: 36.1 % (ref 36.0–46.0)
Hemoglobin: 12.3 g/dL (ref 12.0–15.0)
MCH: 30.1 pg (ref 26.0–34.0)
MCHC: 34.1 g/dL (ref 30.0–36.0)
MCV: 88.3 fL (ref 80.0–100.0)
Platelets: 289 10*3/uL (ref 150–400)
RBC: 4.09 MIL/uL (ref 3.87–5.11)
RDW: 13.1 % (ref 11.5–15.5)
WBC: 6.1 10*3/uL (ref 4.0–10.5)
nRBC: 0 % (ref 0.0–0.2)

## 2021-10-14 LAB — LIPASE, BLOOD: Lipase: 33 U/L (ref 11–51)

## 2021-10-14 LAB — URINALYSIS, ROUTINE W REFLEX MICROSCOPIC
Bacteria, UA: NONE SEEN
Bilirubin Urine: NEGATIVE
Glucose, UA: NEGATIVE mg/dL
Hgb urine dipstick: NEGATIVE
Ketones, ur: NEGATIVE mg/dL
Leukocytes,Ua: NEGATIVE
Nitrite: NEGATIVE
Protein, ur: 30 mg/dL — AB
Specific Gravity, Urine: 1.026 (ref 1.005–1.030)
pH: 5 (ref 5.0–8.0)

## 2021-10-14 LAB — ABO/RH: ABO/RH(D): A POS

## 2021-10-14 NOTE — MAU Provider Note (Signed)
History     CSN: [id number]  Arrival date and time: 10/14/21 5638   Event Date/Time   First Provider Initiated Contact with Patient 10/14/21 [phone]      Chief Complaint  Patient presents with   Abdominal Pain   HPI  Mariah Haas is a 25 y.o. G1P0 at [redacted]w[redacted]d who presents to MAU with chief complaint of LUQ pain, weakness and near syncope. These are recurrent problems, most recent episode July 2022. Patient states she s/p outpatient Cardiology evaluation and was told she needed to improve her eating and continue to monitor her symptoms.  Patient endorses LUQ abdominal pain. Pain score 5/10. Pain does not radiate. She denies aggravating or alleviating factors. She has not taken medication or tried other treatments for this complaint.  Patient denies chest pain, palpitations, syncope. 24 hour diet recall includes a portion of a has brown and apple juice.   OB History     Gravida  1   Para      Term      Preterm      AB      Living         SAB      IAB      Ectopic      Multiple      Live Births              Past Medical History:  Diagnosis Date   Allergy    Anxiety    Asthma     Past Surgical History:  Procedure Laterality Date   NO PAST SURGERIES      Family History  Problem Relation Age of Onset   Seizures Mother    Bipolar disorder Mother    Anxiety disorder Mother    Migraines Mother    Headache Mother    Diabetes Mother    Lung cancer Maternal Grandmother    Migraines Maternal Grandfather    Bipolar disorder Maternal Grandfather    Seizures Maternal Aunt        Febrile Szs as an infant   Migraines Maternal Aunt    Bipolar disorder Maternal Aunt    Anxiety disorder Maternal Aunt    Cancer Maternal Aunt        lung   Cancer Paternal Uncle        Astrocytoma    Social History   Tobacco Use   Smoking status: Never   Smokeless tobacco: Never  Vaping Use   Vaping Use: Never used  Substance Use Topics   Alcohol use: No    Drug use: No    Allergies:  Allergies  Allergen Reactions   Blueberry [Vaccinium Angustifolium]    Theraflu Cold & [Phenylephrine-Pheniramine-Dm]     ?sob/vomitin    Medications Prior to Admission  Medication Sig Dispense Refill Last Dose   Prenatal Vit-Fe Fumarate-FA (MULTIVITAMIN-PRENATAL) 27-0.8 MG TABS tablet Take 1 tablet by mouth daily at 12 noon.   10/13/2021   Blood Pressure Monitoring DEVI 1 each by Does not apply route once a week. 1 each 0    mirtazapine (REMERON) 7.5 MG tablet Take 1 tablet (7.5 mg total) by mouth at bedtime. (Patient not taking: No sig reported) 90 tablet 1    SPRINTEC 28 0.25-35 MG-MCG tablet Take 1 tablet by mouth daily. (Patient not taking: Reported on 10/03/2021) 28 tablet 11     Review of Systems  Constitutional:  Positive for fatigue.  Gastrointestinal:  Positive for abdominal pain.  Neurological:  Positive for dizziness.  All other systems reviewed and are negative. Physical Exam   Blood pressure 117/64, pulse 88, temperature 99.2 F (37.3 C), temperature source Oral, resp. rate 16, height 5' (1.524 m), weight 42 kg, last menstrual period 08/05/2021, SpO2 100 %.  Physical Exam Vitals and nursing note reviewed. Exam conducted with a chaperone present.  Constitutional:      Appearance: She is well-developed.  Cardiovascular:     Rate and Rhythm: Normal rate and regular rhythm.     Heart sounds: Normal heart sounds.  Pulmonary:     Effort: Pulmonary effort is normal.     Breath sounds: Normal breath sounds.  Abdominal:     General: Abdomen is flat. Bowel sounds are normal.     Palpations: Abdomen is soft.     Tenderness: There is no abdominal tenderness.  Skin:    General: Skin is warm and dry.     Capillary Refill: Capillary refill takes less than 2 seconds.  Neurological:     Mental Status: She is alert and oriented to person, place, and time.  Psychiatric:        Mood and Affect: Mood normal.        Behavior: Behavior normal.     MAU Course/MDM  Procedures  Orders Placed This Encounter  Procedures   Urinalysis, Routine w reflex microscopic Urine, Clean Catch   CBC   Comprehensive metabolic panel   Lipase, blood   ED EKG   ABO/Rh   Discharge patient   Patient Vitals for the past 24 hrs:  BP Temp Temp src Pulse Resp SpO2 Height Weight  10/14/21 1015 114/60 -- -- 73 -- 100 % -- --  10/14/21 0829 116/67 -- -- 96 -- -- -- --  10/14/21 0817 117/64 99.2 F (37.3 C) Oral 88 16 100 % -- --  10/14/21 0810 -- -- -- -- -- -- 5' (1.524 m) 42 kg   Results for orders placed or performed during the hospital encounter of 10/14/21 (from the past 24 hour(s))  Urinalysis, Routine w reflex microscopic Urine, Clean Catch     Status: Abnormal   Collection Time: 10/14/21  8:08 AM  Result Value Ref Range   Color, Urine YELLOW YELLOW   APPearance HAZY (A) CLEAR   Specific Gravity, Urine 1.026 1.005 - 1.030   pH 5.0 5.0 - 8.0   Glucose, UA NEGATIVE NEGATIVE mg/dL   Hgb urine dipstick NEGATIVE NEGATIVE   Bilirubin Urine NEGATIVE NEGATIVE   Ketones, ur NEGATIVE NEGATIVE mg/dL   Protein, ur 30 (A) NEGATIVE mg/dL   Nitrite NEGATIVE NEGATIVE   Leukocytes,Ua NEGATIVE NEGATIVE   RBC / HPF 0-5 0 - 5 RBC/hpf   WBC, UA 0-5 0 - 5 WBC/hpf   Bacteria, UA NONE SEEN NONE SEEN   Squamous Epithelial / LPF 6-10 0 - 5   Mucus PRESENT    Ca Oxalate Crys, UA PRESENT   CBC     Status: None   Collection Time: 10/14/21  8:32 AM  Result Value Ref Range   WBC 6.1 4.0 - 10.5 K/uL   RBC 4.09 3.87 - 5.11 MIL/uL   Hemoglobin 12.3 12.0 - 15.0 g/dL   HCT 72.5 36.6 - 44.0 %   MCV 88.3 80.0 - 100.0 fL   MCH 30.1 26.0 - 34.0 pg   MCHC 34.1 30.0 - 36.0 g/dL   RDW 34.7 42.5 - 95.6 %   Platelets 289 150 - 400 K/uL   nRBC 0.0 0.0 - 0.2 %  Comprehensive metabolic  panel     Status: Abnormal   Collection Time: 10/14/21  8:32 AM  Result Value Ref Range   Sodium 133 (L) 135 - 145 mmol/L   Potassium 3.6 3.5 - 5.1 mmol/L   Chloride 104 98 - 111  mmol/L   CO2 23 22 - 32 mmol/L   Glucose, Bld 90 70 - 99 mg/dL   BUN 8 6 - 20 mg/dL   Creatinine, Ser 8.32 0.44 - 1.00 mg/dL   Calcium 9.1 8.9 - 91.9 mg/dL   Total Protein 6.6 6.5 - 8.1 g/dL   Albumin 3.6 3.5 - 5.0 g/dL   AST 18 15 - 41 U/L   ALT 24 0 - 44 U/L   Alkaline Phosphatase 32 (L) 38 - 126 U/L   Total Bilirubin 0.2 (L) 0.3 - 1.2 mg/dL   GFR, Estimated >16 >60 mL/min   Anion gap 6 5 - 15  Lipase, blood     Status: None   Collection Time: 10/14/21  8:32 AM  Result Value Ref Range   Lipase 33 11 - 51 U/L  ABO/Rh     Status: None   Collection Time: 10/14/21  8:35 AM  Result Value Ref Range   ABO/RH(D) A POS    No rh immune globuloin      NOT A RH IMMUNE GLOBULIN CANDIDATE, PT RH POSITIVE Performed at [facility], [address], [place], [place] [address]     Assessment and Plan  --25 y.o. G1P0 at [redacted]w[redacted]d  --Live IUP on BSUS --No abnormal findings on labs collected in MAU --Normal ECG in MAU --Hgb 12.3 --Discussed physiologic changes in first trimester --24 our diet recall with very little protein --Advised clean protein snack at bedtime and first thing in the morning to reduce symptoms --Discharge home in stable condition  F/U: New OB (M/B [facility]) 10/31/2021  Calvert Cantor, CNM 10/14/2021, 2:49 PM

## 2021-10-14 NOTE — MAU Note (Signed)
Mariah Haas is a 25 y.o. at [redacted]w[redacted]d here in MAU reporting: today while she was at work she said out of nowhere her mouth got numb and vision got blurry. Someone took her BP and it was 106/something but she felt like she was going to have a syncopal episode. Has had a little bit of hashbrown and apple juice. Having some left upper abdominal pain.   Onset of complaint: today  Pain score: 5/10  Vitals:   10/14/21 0817  BP: 117/64  Pulse: 88  Resp: 16  Temp: 99.2 F (37.3 C)  SpO2: 100%     ELT:[id number] attempted  Lab orders placed from triage: UA

## 2021-10-14 NOTE — Discharge Instructions (Signed)

## 2021-10-17 ENCOUNTER — Encounter: Payer: PRIVATE HEALTH INSURANCE | Admitting: Obstetrics and Gynecology

## 2021-10-31 ENCOUNTER — Other Ambulatory Visit: Payer: Self-pay

## 2021-10-31 ENCOUNTER — Ambulatory Visit (INDEPENDENT_AMBULATORY_CARE_PROVIDER_SITE_OTHER): Payer: No Typology Code available for payment source | Admitting: Family Medicine

## 2021-10-31 ENCOUNTER — Other Ambulatory Visit (HOSPITAL_COMMUNITY)
Admission: RE | Admit: 2021-10-31 | Discharge: 2021-10-31 | Disposition: A | Discharge status: Home / Self Care | Payer: Medicaid Other | Source: Ambulatory Visit | Attending: Obstetrics and Gynecology | Admitting: Obstetrics and Gynecology

## 2021-10-31 VITALS — BP 109/75 | HR 91 | Wt 98.2 lb

## 2021-10-31 DIAGNOSIS — Z124 Encounter for screening for malignant neoplasm of cervix: Secondary | ICD-10-CM | POA: Insufficient documentation

## 2021-10-31 DIAGNOSIS — Z3480 Encounter for supervision of other normal pregnancy, unspecified trimester: Secondary | ICD-10-CM

## 2021-10-31 NOTE — Patient Instructions (Signed)

## 2021-10-31 NOTE — Progress Notes (Signed)
Subjective:   Mariah Haas is a 25 y.o. G1P0 at [redacted]w[redacted]d by LMP being seen today for her first obstetrical visit.  Her obstetrical history is significant for  n/a . Patient does intend to breast feed. Pregnancy history fully reviewed.  Patient reports no complaints.  HISTORY: OB History  Gravida Para Term Preterm AB Living  1 0 0 0 0 0  SAB IAB Ectopic Multiple Live Births  0 0 0 0 0    # Outcome Date GA Lbr Len/2nd Weight Sex Delivery Anes PTL Lv  1 Current              Last pap smear: No results found for: DIAGPAP, HPV, HPVHIGH   Past Medical History:  Diagnosis Date   Allergy    Anxiety    Asthma    Past Surgical History:  Procedure Laterality Date   NO PAST SURGERIES     Family History  Problem Relation Age of Onset   Seizures Mother    Bipolar disorder Mother    Anxiety disorder Mother    Migraines Mother    Headache Mother    Diabetes Mother    Lung cancer Maternal Grandmother    Migraines Maternal Grandfather    Bipolar disorder Maternal Grandfather    Seizures Maternal Aunt        Febrile Szs as an infant   Migraines Maternal Aunt    Bipolar disorder Maternal Aunt    Anxiety disorder Maternal Aunt    Cancer Maternal Aunt        lung   Cancer Paternal Uncle        Astrocytoma   Social History   Tobacco Use   Smoking status: Never   Smokeless tobacco: Never  Vaping Use   Vaping Use: Never used  Substance Use Topics   Alcohol use: No   Drug use: No   Allergies  Allergen Reactions   Blueberry [Vaccinium Angustifolium]    Theraflu Cold & [Phenylephrine-Pheniramine-Dm]     ?sob/vomitin   Current Outpatient Medications on File Prior to Visit  Medication Sig Dispense Refill   Blood Pressure Monitoring DEVI 1 each by Does not apply route once a week. 1 each 0   Prenatal Vit-Fe Fumarate-FA (MULTIVITAMIN-PRENATAL) 27-0.8 MG TABS tablet Take 1 tablet by mouth daily at 12 noon.     No current facility-administered medications on file  prior to visit.     Exam   Vitals:   10/31/21 1348  BP: 109/75  Pulse: 91  Weight: 98 lb 3.2 oz (44.5 kg)   Fetal Heart Rate (bpm): 154  Uterus:     Pelvic Exam: Perineum: no hemorrhoids, normal perineum   Vulva: normal external genitalia, no lesions   Vagina:  normal mucosa mild amount of white discharge but denies any symptoms   Cervix: no lesions and normal, pap smear done.   System: General: well-developed, well-nourished female in no acute distress   Skin: normal coloration and turgor, no rashes   Neurologic: oriented, normal, negative, normal mood   Extremities: normal strength, tone, and muscle mass, ROM of all joints is normal   HEENT PERRLA, extraocular movement intact and sclera clear, anicteric   Neck supple and no masses   Respiratory:  no respiratory distress      Assessment:   Pregnancy: G1P0 Patient Active Problem List   Diagnosis Date Noted   Supervision of other normal pregnancy, antepartum 10/03/2021   Mild intermittent asthma, uncomplicated 02/06/2018   Seasonal  and perennial allergic rhinitis 02/06/2018   Cyst of ovary 12/16/2017   Allergic rhinitis due to allergen 03/18/2017   Migraine without aura and without status migrainosus, not intractable 01/28/2014     Plan:  1. Supervision of other normal pregnancy, antepartum Initial labs drawn. Continue prenatal vitamins. Genetic Screening discussed, NIPS: ordered. Ultrasound discussed; fetal anatomic survey: ordered. Problem list reviewed and updated. The nature of [facility] clinic was explained to patient; Voiced they may need to be seen by other [facility] providers which includes family medicine physicians, OB GYNs, and APPs. Delivery will hopefully be with one of the [facility] providers or another [facility] Medicine physician and we cannot promise this at this time.  Discussed there are [facility] staff in the hospital 24-7 and they understand and support this model and there is a likelihood one of these  providers will catch their baby.  We also discussed that the service includes learners (residents, student) and they will be involved in the care team.    Routine obstetric precautions reviewed. Return in 4 weeks (on 11/28/2021) for [facility] patient, ob visit.

## 2021-11-01 ENCOUNTER — Encounter: Payer: Self-pay | Admitting: Family Medicine

## 2021-11-01 DIAGNOSIS — Z2839 Other underimmunization status: Secondary | ICD-10-CM | POA: Insufficient documentation

## 2021-11-01 LAB — HCV INTERPRETATION

## 2021-11-01 LAB — CBC/D/PLT+RPR+RH+ABO+RUBIGG...
Antibody Screen: NEGATIVE
Basophils Absolute: 0 10*3/uL (ref 0.0–0.2)
Basos: 0 %
EOS (ABSOLUTE): 0.1 10*3/uL (ref 0.0–0.4)
Eos: 1 %
HCV Ab: <0.1 s/co ratio (ref 0.0–0.9)
HIV Screen 4th Generation wRfx: NONREACTIVE
Hematocrit: 36 % (ref 34.0–46.6)
Hemoglobin: 12.1 g/dL (ref 11.1–15.9)
Hepatitis B Surface Ag: NEGATIVE
Immature Grans (Abs): 0 10*3/uL (ref 0.0–0.1)
Immature Granulocytes: 1 %
Lymphocytes Absolute: 1.9 10*3/uL (ref 0.7–3.1)
Lymphs: 22 %
MCH: 29.5 pg (ref 26.6–33.0)
MCHC: 33.6 g/dL (ref 31.5–35.7)
MCV: 88 fL (ref 79–97)
Monocytes Absolute: 0.5 10*3/uL (ref 0.1–0.9)
Monocytes: 5 %
Neutrophils Absolute: 6.1 10*3/uL (ref 1.4–7.0)
Neutrophils: 71 %
Platelets: 305 10*3/uL (ref 150–450)
RBC: 4.1 x10E6/uL (ref 3.77–5.28)
RDW: 12.5 % (ref 11.7–15.4)
RPR Ser Ql: NONREACTIVE
Rh Factor: POSITIVE
Rubella Antibodies, IGG: <0.9 index — ABNORMAL LOW (ref >0.99)
WBC: 8.6 10*3/uL (ref 3.4–10.8)

## 2021-11-01 LAB — HEMOGLOBIN A1C
Est. average glucose Bld gHb Est-mCnc: 97 mg/dL
Hgb A1c MFr Bld: 5 % (ref 4.8–5.6)

## 2021-11-02 LAB — CYTOLOGY - PAP
Chlamydia: NEGATIVE
Comment: NEGATIVE
Comment: NEGATIVE
Comment: NEGATIVE
Comment: NORMAL
Diagnosis: NEGATIVE
High risk HPV: NEGATIVE
Neisseria Gonorrhea: NEGATIVE
Trichomonas: NEGATIVE

## 2021-11-03 LAB — CULTURE, OB URINE

## 2021-11-03 LAB — URINE CULTURE, OB REFLEX

## 2021-11-14 ENCOUNTER — Encounter: Payer: Self-pay | Admitting: Family Medicine

## 2021-11-14 ENCOUNTER — Telehealth: Payer: Self-pay | Admitting: Lactation Services

## 2021-11-14 DIAGNOSIS — D563 Thalassemia minor: Secondary | ICD-10-CM | POA: Insufficient documentation

## 2021-11-14 NOTE — Telephone Encounter (Signed)
Called patient with results of [employer] screening showing she is a silent carrier for Alpha Thalassemia.   Reviewed it is recommended that she call [facility] at [phone] to schedule a telephone Genetic Counseling Session to discuss results further.   Reviewed it is recommended that father of the baby also be tested to see if he carries the same gene. Reviewed we have the [employer] kits in the office that she can pick up at her next appointment for him to complete and ship back.   Patient voiced understanding with no questions or concerns at this time.

## 2021-11-19 NOTE — L&D Delivery Note (Incomplete)
OB/GYN Faculty Practice Delivery Note  Mariah Haas is a 26 y.o. G1P0 s/p VAVD at [redacted]w[redacted]d. She was admitted for post dates IOL.   ROM: 9h 21m with clear fluid GBS Status:  Negative/-- (05/24 1506) Maximum Maternal Temperature: 98.3 F    Labor Progress: Patient arrived at 1 cm dilation and was induced with foley bulb and one dose of misoprostol initially. She then progressed to about 5 cm and had SROM. She remained that way for several hours, at which point pitocin was started and a forebag was ruptured. She then progressed over several hours to complete and started pushing. After approximately 2.5 hours of pushing and despite trying multiple different positions and pushing techniques patient was beginning to fatigue, and in addition baby's position was determined to be ROP which was contributing significantly to lack of progress. She was subsequently consented for and had a successful VAVD.    Operative Delivery Documentation  Delivery Date/Time: 05/10/2022 at 2203  Infant was delivered via Vacuum Assisted Vaginal Delivery due to maternal fatigue and malpresentation.  The patient was examined and found to be Presentation: vertex; Position: Right,, Occiput,, Posterior; Station: +4.  Verbal consent: obtained from patient.  Risks and benefits discussed in detail.  Risks include, but are not limited to the risks of anesthesia, bleeding, infection, damage to maternal tissues, fetal cephalhematoma.  There is also the risk of inability to effect vaginal delivery of the head, or shoulder dystocia that cannot be resolved by established maneuvers, leading to the need for emergency cesarean section.  The Kiwi vacuum was positioned over the sagittal suture 3 cm anterior to posterior fontanelle.  Pressure was then increased to 500 mmHg, and the patient was instructed to push.  Pulling was administered along the pelvic curve.  5 pulls were administered during 5 contractions, with release of pressure  between contractions. The initial three pulls were performed by Dr. Annia Friendly with one pop-off. I subsequently performed two pulls with no pop offs and the infant was then delivered atraumatically.  Head delivered in ROP position. No nuchal cord present, but compound arm presentation was present posteriorly and was relieved. Anterior shoulder and body then delivered in usual fashion. Infant with spontaneous cry, placed on mother's abdomen, dried and stimulated. Cord clamped x 2 after 1-minute delay, and cut by FOB [place]. Cord blood drawn. Placenta delivered spontaneously with gentle cord traction. Fundus firm with massage and Pitocin. Labia, perineum, vagina, and cervix inspected with 1st degree perineal and R labial lacerations, both repaired.   After delivery of placenta a Mirena IUD was placed, see separate procedure note for details.   Sponge, instrument and needle counts were correct x2.  Placenta: 3v intact to L&D Complications: none Lacerations: 1st degree perineal repaired with 3-0 vicryl, and R labial laceration repaired with 4-0 monocryl EBL: 50 cc Analgesia: epidural, local   Infant: Baby girl "Haven"  APGAR (1 MIN): 8   APGAR (5 MINS): 9    Weight: ***  Venora Maples, MD/MPH Attending Family Medicine Physician, [facility] for [facility], [facility]

## 2021-11-27 ENCOUNTER — Other Ambulatory Visit: Payer: Self-pay

## 2021-11-27 ENCOUNTER — Ambulatory Visit (INDEPENDENT_AMBULATORY_CARE_PROVIDER_SITE_OTHER): Payer: No Typology Code available for payment source | Admitting: Family Medicine

## 2021-11-27 VITALS — Wt 93.3 lb

## 2021-11-27 DIAGNOSIS — Z3480 Encounter for supervision of other normal pregnancy, unspecified trimester: Secondary | ICD-10-CM

## 2021-11-27 NOTE — Progress Notes (Signed)
° °  PRENATAL VISIT NOTE  Subjective:  Mariah Haas is a 26 y.o. G1P0 at 30w2dbeing seen today for ongoing prenatal care.  She is currently monitored for the following issues for this low-risk pregnancy and has Migraine without aura and without status migrainosus, not intractable; Allergic rhinitis due to allergen; Cyst of ovary; Mild intermittent asthma, uncomplicated; Seasonal and perennial allergic rhinitis; Supervision of other normal pregnancy, antepartum; Rubella non-immune status, antepartum; and Alpha thalassemia silent carrier on their problem list.  Patient reports no complaints.  Contractions: Not present. Vag. Bleeding: None.  Movement: Absent. Denies leaking of fluid.   The following portions of the patient's history were reviewed and updated as appropriate: allergies, current medications, past family history, past medical history, past social history, past surgical history and problem list.   Objective:   Vitals:   11/27/21 1033  Weight: 93 lb 4.8 oz (42.3 kg)    Fetal Status: Fetal Heart Rate (bpm): 155   Movement: Absent     General:  Alert, oriented and cooperative. Patient is in no acute distress.  Skin: Skin is warm and dry. No rash noted.   Cardiovascular: Normal heart rate noted  Respiratory: Normal respiratory effort, no problems with respiration noted  Abdomen: Soft, gravid, appropriate for gestational age.  Pain/Pressure: Present     Pelvic: Cervical exam deferred        Extremities: Normal range of motion.  Edema: None  Mental Status: Normal mood and affect. Normal behavior. Normal judgment and thought content.   Assessment and Plan:  Pregnancy: G1P0 at 133w2d. Supervision of other normal pregnancy, antepartum Discussed weight gain. She is worried about how little weight she has gained. TWG=4 lb 4.8 oz (1.95 kg) which is at goal. Discussed TWG goal of 25-35 lbs.  Reviewed plan for care with [place] AFP. NIP was WNL.  - Reviewed safety of movement in  pregnancy - Partner given carrier screening kit today - AFP, Serum, Open Spina Bifida  Preterm labor symptoms and general obstetric precautions including but not limited to vaginal bleeding, contractions, leaking of fluid and fetal movement were reviewed in detail with the patient. Please refer to After Visit Summary for other counseling recommendations.   Return in about 4 weeks (around 12/25/2021) for scheduled visit, Mom+Baby Combined Care.  Future Appointments  Date Time Provider DeBenson1/30/2023  2:15 PM [facility]/06/2022  3:55 PM [facility]/05/2022 10:15 AM [facility] [place]  KiCaren MacadamMD

## 2021-11-29 LAB — AFP, SERUM, OPEN SPINA BIFIDA
AFP MoM: 1.1
AFP Value: 55.6 ng/mL
Gest. Age on Collection Date: 16.2 weeks
Maternal Age At EDD: 23.8 yr
OSBR Risk 1 IN: 8933
Test Results:: NEGATIVE
Weight: 93 [lb_av]

## 2021-12-13 ENCOUNTER — Encounter: Payer: Self-pay | Admitting: *Deleted

## 2021-12-18 ENCOUNTER — Encounter: Payer: Self-pay | Admitting: Family Medicine

## 2021-12-18 ENCOUNTER — Other Ambulatory Visit: Payer: Self-pay | Admitting: *Deleted

## 2021-12-18 ENCOUNTER — Other Ambulatory Visit: Payer: Self-pay

## 2021-12-18 ENCOUNTER — Ambulatory Visit: Payer: No Typology Code available for payment source | Attending: Family Medicine

## 2021-12-18 DIAGNOSIS — Z363 Encounter for antenatal screening for malformations: Secondary | ICD-10-CM | POA: Diagnosis not present

## 2021-12-18 DIAGNOSIS — Z3A19 19 weeks gestation of pregnancy: Secondary | ICD-10-CM | POA: Diagnosis not present

## 2021-12-18 DIAGNOSIS — Z3480 Encounter for supervision of other normal pregnancy, unspecified trimester: Secondary | ICD-10-CM | POA: Diagnosis not present

## 2021-12-18 DIAGNOSIS — Z3689 Encounter for other specified antenatal screening: Secondary | ICD-10-CM

## 2021-12-18 DIAGNOSIS — O358XX0 Maternal care for other (suspected) fetal abnormality and damage, not applicable or unspecified: Secondary | ICD-10-CM | POA: Diagnosis present

## 2021-12-27 ENCOUNTER — Ambulatory Visit (INDEPENDENT_AMBULATORY_CARE_PROVIDER_SITE_OTHER): Payer: No Typology Code available for payment source | Admitting: Family Medicine

## 2021-12-27 ENCOUNTER — Encounter: Payer: Self-pay | Admitting: Family Medicine

## 2021-12-27 ENCOUNTER — Other Ambulatory Visit: Payer: Self-pay

## 2021-12-27 VITALS — BP 113/76 | HR 79 | Wt 96.2 lb

## 2021-12-27 DIAGNOSIS — Z3480 Encounter for supervision of other normal pregnancy, unspecified trimester: Secondary | ICD-10-CM

## 2021-12-27 NOTE — Progress Notes (Signed)
° ° °  PRENATAL VISIT NOTE  Subjective:  Mariah Haas is a 26 y.o. G1P0 at [redacted]w[redacted]d being seen today for ongoing prenatal care.  She is currently monitored for the following issues for this low-risk pregnancy and has Migraine without aura and without status migrainosus, not intractable; Allergic rhinitis due to allergen; Cyst of ovary; Mild intermittent asthma, uncomplicated; Seasonal and perennial allergic rhinitis; Supervision of other normal pregnancy, antepartum; Rubella non-immune status, antepartum; and Alpha thalassemia silent carrier on their problem list.  Patient reports no complaints.  Contractions: Not present. Vag. Bleeding: None.  Movement: Present. Denies leaking of fluid.   The following portions of the patient's history were reviewed and updated as appropriate: allergies, current medications, past family history, past medical history, past social history, past surgical history and problem list.   Objective:   Vitals:   12/27/21 1616  BP: 113/76  Pulse: 79  Weight: 96 lb 3.2 oz (43.6 kg)    Fetal Status: Fetal Heart Rate (bpm): 145   Movement: Present     General:  Alert, oriented and cooperative. Patient is in no acute distress.  Skin: Skin is warm and dry. No rash noted.   Cardiovascular: Normal heart rate noted  Respiratory: Normal respiratory effort, no problems with respiration noted  Abdomen: Soft, gravid, appropriate for gestational age.  Pain/Pressure: Absent     Pelvic: Cervical exam deferred        Extremities: Normal range of motion.  Edema: None  Mental Status: Normal mood and affect. Normal behavior. Normal judgment and thought content.   Assessment and Plan:  Pregnancy: G1P0 at [redacted]w[redacted]d  1. Supervision of other normal pregnancy, antepartum Up to date Discussed low BP and dizziness- recommended increased protein and fluids Discussed timing of GTT at 26-28 weeks Reviewed warning s/sx for dizziness and patient will monitor sx.  Reviewed anatomy scan  with fetus in the 5th%-- has repeat US for growth   Preterm labor symptoms and general obstetric precautions including but not limited to vaginal bleeding, contractions, leaking of fluid and fetal movement were reviewed in detail with the patient. Please refer to After Visit Summary for other counseling recommendations.   No follow-ups on file.  Future Appointments  Date Time Provider Department Center  01/22/2022  8:30 AM [facility] NURSE [facility]  01/22/2022  8:45 AM [facility] US4 [facility]  01/23/2022 10:15 AM [facility]    Federico Flake, MD

## 2022-01-01 ENCOUNTER — Other Ambulatory Visit: Payer: Self-pay

## 2022-01-01 ENCOUNTER — Inpatient Hospital Stay (HOSPITAL_COMMUNITY)
Admission: AD | Admit: 2022-01-01 | Discharge: 2022-01-01 | Disposition: A | Discharge status: Home / Self Care | Payer: No Typology Code available for payment source | Attending: Obstetrics & Gynecology | Admitting: Obstetrics & Gynecology

## 2022-01-01 ENCOUNTER — Encounter (HOSPITAL_COMMUNITY): Payer: Self-pay | Admitting: Obstetrics & Gynecology

## 2022-01-01 DIAGNOSIS — O218 Other vomiting complicating pregnancy: Secondary | ICD-10-CM | POA: Insufficient documentation

## 2022-01-01 DIAGNOSIS — K529 Noninfective gastroenteritis and colitis, unspecified: Secondary | ICD-10-CM | POA: Diagnosis not present

## 2022-01-01 DIAGNOSIS — Z3A21 21 weeks gestation of pregnancy: Secondary | ICD-10-CM | POA: Diagnosis not present

## 2022-01-01 DIAGNOSIS — O26892 Other specified pregnancy related conditions, second trimester: Secondary | ICD-10-CM | POA: Insufficient documentation

## 2022-01-01 LAB — URINALYSIS, ROUTINE W REFLEX MICROSCOPIC
Bacteria, UA: NONE SEEN
Bilirubin Urine: NEGATIVE
Glucose, UA: NEGATIVE mg/dL
Hgb urine dipstick: NEGATIVE
Ketones, ur: 80 mg/dL — AB
Leukocytes,Ua: NEGATIVE
Nitrite: NEGATIVE
Protein, ur: 30 mg/dL — AB
Specific Gravity, Urine: 1.026 (ref 1.005–1.030)
pH: 5 (ref 5.0–8.0)

## 2022-01-01 LAB — CBC WITH DIFFERENTIAL/PLATELET
Abs Immature Granulocytes: 0.05 10*3/uL (ref 0.00–0.07)
Basophils Absolute: 0 10*3/uL (ref 0.0–0.1)
Basophils Relative: 0 %
Eosinophils Absolute: 0 10*3/uL (ref 0.0–0.5)
Eosinophils Relative: 0 %
HCT: 39 % (ref 36.0–46.0)
Hemoglobin: 12.6 g/dL (ref 12.0–15.0)
Immature Granulocytes: 0 %
Lymphocytes Relative: 4 %
Lymphs Abs: 0.5 10*3/uL — ABNORMAL LOW (ref 0.7–4.0)
MCH: 29.5 pg (ref 26.0–34.0)
MCHC: 32.3 g/dL (ref 30.0–36.0)
MCV: 91.3 fL (ref 80.0–100.0)
Monocytes Absolute: 0.2 10*3/uL (ref 0.1–1.0)
Monocytes Relative: 2 %
Neutro Abs: 11.4 10*3/uL — ABNORMAL HIGH (ref 1.7–7.7)
Neutrophils Relative %: 94 %
Platelets: 273 10*3/uL (ref 150–400)
RBC: 4.27 MIL/uL (ref 3.87–5.11)
RDW: 12.4 % (ref 11.5–15.5)
WBC: 12.2 10*3/uL — ABNORMAL HIGH (ref 4.0–10.5)
nRBC: 0 % (ref 0.0–0.2)

## 2022-01-01 LAB — COMPREHENSIVE METABOLIC PANEL
ALT: 26 U/L (ref 0–44)
AST: 25 U/L (ref 15–41)
Albumin: 3.4 g/dL — ABNORMAL LOW (ref 3.5–5.0)
Alkaline Phosphatase: 42 U/L (ref 38–126)
Anion gap: 10 (ref 5–15)
BUN: 10 mg/dL (ref 6–20)
CO2: 20 mmol/L — ABNORMAL LOW (ref 22–32)
Calcium: 9 mg/dL (ref 8.9–10.3)
Chloride: 107 mmol/L (ref 98–111)
Creatinine, Ser: 0.61 mg/dL (ref 0.44–1.00)
GFR, Estimated: >60 mL/min (ref >60)
Glucose, Bld: 82 mg/dL (ref 70–99)
Potassium: 4.1 mmol/L (ref 3.5–5.1)
Sodium: 137 mmol/L (ref 135–145)
Total Bilirubin: 0.6 mg/dL (ref 0.3–1.2)
Total Protein: 7 g/dL (ref 6.5–8.1)

## 2022-01-01 LAB — LIPASE, BLOOD: Lipase: 30 U/L (ref 11–51)

## 2022-01-01 MED ORDER — ONDANSETRON HCL 4 MG/2ML IJ SOLN
4.0000 mg | Freq: Once | INTRAMUSCULAR | Status: AC
  Administered 2022-01-01: 4 mg via INTRAVENOUS
  Filled 2022-01-01: qty 2

## 2022-01-01 MED ORDER — LACTATED RINGERS IV BOLUS
1000.0000 mL | Freq: Once | INTRAVENOUS | Status: AC
  Administered 2022-01-01: 1000 mL via INTRAVENOUS

## 2022-01-01 MED ORDER — ONDANSETRON 4 MG PO TBDP
4.0000 mg | ORAL_TABLET | Freq: Four times a day (QID) | ORAL | 0 refills | Status: DC | PRN
Start: 2022-01-01 — End: 2022-01-23

## 2022-01-01 MED ORDER — FAMOTIDINE IN NACL 20-0.9 MG/50ML-% IV SOLN
20.0000 mg | Freq: Once | INTRAVENOUS | Status: AC
  Administered 2022-01-01: 20 mg via INTRAVENOUS
  Filled 2022-01-01: qty 50

## 2022-01-01 NOTE — MAU Note (Signed)
Mariah Haas is a 26 y.o. at [redacted]w[redacted]d here in MAU reporting: started vomiting this morning and was having diarrhea. States was not able to keep down water. Also having upper abdominal pain. No bleeding, LOF, or discharge. 5 episodes of emesis and diarrhea.   Onset of complaint: today  Pain score: 6/10  Vitals:   01/01/22 1520  BP: (!) 108/52  Pulse: 99  Resp: 16  Temp: 98.4 F (36.9 C)  SpO2: 100%     FHT:154  Lab orders placed from triage: UA

## 2022-01-01 NOTE — MAU Provider Note (Signed)
History     CSN: [id number]  Arrival date and time: 01/01/22 1507   Event Date/Time   First Provider Initiated Contact with Patient 01/01/22 1538      Chief Complaint  Patient presents with   Nausea   Abdominal Pain   Diarrhea   HPI Mariah Haas is a 26 y.o. G1P0 at [redacted]w[redacted]d who presents with n/v/d. Symptoms started this morning. Reports vomiting countless times today & having 5 episode of watery stool. Denies sick contacts. Ate a philly cheese steak sub last night which she attributes her symptoms to.  Reports some epigastric pain that is worse when she vomits. Also reports constant low back pain since vomiting started. Hasn't taken anything for her symptoms. Unable to keep down water. Hasn't tried to eat today.  Denies fever, lower abdominal pain, vaginal bleeding, or LOF.   OB History     Gravida  1   Para      Term      Preterm      AB      Living         SAB      IAB      Ectopic      Multiple      Live Births              Past Medical History:  Diagnosis Date   Allergy    Anxiety    Asthma     Past Surgical History:  Procedure Laterality Date   NO PAST SURGERIES      Family History  Problem Relation Age of Onset   Seizures Mother    Bipolar disorder Mother    Anxiety disorder Mother    Migraines Mother    Headache Mother    Diabetes Mother    Lung cancer Maternal Grandmother    Migraines Maternal Grandfather    Bipolar disorder Maternal Grandfather    Seizures Maternal Aunt        Febrile Szs as an infant   Migraines Maternal Aunt    Bipolar disorder Maternal Aunt    Anxiety disorder Maternal Aunt    Cancer Maternal Aunt        lung   Cancer Paternal Uncle        Astrocytoma    Social History   Tobacco Use   Smoking status: Never   Smokeless tobacco: Never  Vaping Use   Vaping Use: Never used  Substance Use Topics   Alcohol use: No   Drug use: No    Allergies:  Allergies  Allergen Reactions   Blueberry  [Vaccinium Angustifolium]    Theraflu Cold & [Phenylephrine-Pheniramine-Dm]     ?sob/vomitin    Medications Prior to Admission  Medication Sig Dispense Refill Last Dose   Prenatal Vit-Fe Fumarate-FA (MULTIVITAMIN-PRENATAL) 27-0.8 MG TABS tablet Take 1 tablet by mouth daily at 12 noon.   Past Week   Blood Pressure Monitoring DEVI 1 each by Does not apply route once a week. 1 each 0     Review of Systems  Constitutional: Negative.   Gastrointestinal:  Positive for abdominal pain, diarrhea, nausea and vomiting.  Genitourinary: Negative.   Musculoskeletal:  Positive for back pain.  Physical Exam   Blood pressure 110/62, pulse 93, temperature 98.4 F (36.9 C), temperature source Oral, resp. rate 16, height 5' (1.524 m), weight 43.5 kg, last menstrual period 08/05/2021, SpO2 100 %.  Physical Exam Vitals and nursing note reviewed.  Constitutional:      Appearance:  She is well-developed.  HENT:     Head: Normocephalic and atraumatic.  Pulmonary:     Effort: Pulmonary effort is normal. No respiratory distress.  Abdominal:     General: Abdomen is flat.     Palpations: Abdomen is soft.     Tenderness: There is no right CVA tenderness or left CVA tenderness.  Skin:    General: Skin is warm and dry.  Neurological:     General: No focal deficit present.     Mental Status: She is alert.  Psychiatric:        Mood and Affect: Mood normal.        Behavior: Behavior normal.    MAU Course  Procedures Results for orders placed or performed during the hospital encounter of 01/01/22 (from the past 24 hour(s))  Comprehensive metabolic panel     Status: Abnormal   Collection Time: 01/01/22  4:00 PM  Result Value Ref Range   Sodium 137 135 - 145 mmol/L   Potassium 4.1 3.5 - 5.1 mmol/L   Chloride 107 98 - 111 mmol/L   CO2 20 (L) 22 - 32 mmol/L   Glucose, Bld 82 70 - 99 mg/dL   BUN 10 6 - 20 mg/dL   Creatinine, Ser 0.61 0.44 - 1.00 mg/dL   Calcium 9.0 8.9 - 10.3 mg/dL   Total Protein 7.0  6.5 - 8.1 g/dL   Albumin 3.4 (L) 3.5 - 5.0 g/dL   AST 25 15 - 41 U/L   ALT 26 0 - 44 U/L   Alkaline Phosphatase 42 38 - 126 U/L   Total Bilirubin 0.6 0.3 - 1.2 mg/dL   GFR, Estimated >60 >60 mL/min   Anion gap 10 5 - 15  Lipase, blood     Status: None   Collection Time: 01/01/22  4:00 PM  Result Value Ref Range   Lipase 30 11 - 51 U/L  CBC with Differential/Platelet     Status: Abnormal   Collection Time: 01/01/22  4:00 PM  Result Value Ref Range   WBC 12.2 (H) 4.0 - 10.5 K/uL   RBC 4.27 3.87 - 5.11 MIL/uL   Hemoglobin 12.6 12.0 - 15.0 g/dL   HCT 39.0 36.0 - 46.0 %   MCV 91.3 80.0 - 100.0 fL   MCH 29.5 26.0 - 34.0 pg   MCHC 32.3 30.0 - 36.0 g/dL   RDW 12.4 11.5 - 15.5 %   Platelets 273 150 - 400 K/uL   nRBC 0.0 0.0 - 0.2 %   Neutrophils Relative % 94 %   Neutro Abs 11.4 (H) 1.7 - 7.7 K/uL   Lymphocytes Relative 4 %   Lymphs Abs 0.5 (L) 0.7 - 4.0 K/uL   Monocytes Relative 2 %   Monocytes Absolute 0.2 0.1 - 1.0 K/uL   Eosinophils Relative 0 %   Eosinophils Absolute 0.0 0.0 - 0.5 K/uL   Basophils Relative 0 %   Basophils Absolute 0.0 0.0 - 0.1 K/uL   Immature Granulocytes 0 %   Abs Immature Granulocytes 0.05 0.00 - 0.07 K/uL  Urinalysis, Routine w reflex microscopic Urine, Clean Catch     Status: Abnormal   Collection Time: 01/01/22  4:42 PM  Result Value Ref Range   Color, Urine YELLOW YELLOW   APPearance HAZY (A) CLEAR   Specific Gravity, Urine 1.026 1.005 - 1.030   pH 5.0 5.0 - 8.0   Glucose, UA NEGATIVE NEGATIVE mg/dL   Hgb urine dipstick NEGATIVE NEGATIVE   Bilirubin Urine NEGATIVE  NEGATIVE   Ketones, ur 80 (A) NEGATIVE mg/dL   Protein, ur 30 (A) NEGATIVE mg/dL   Nitrite NEGATIVE NEGATIVE   Leukocytes,Ua NEGATIVE NEGATIVE   RBC / HPF 0-5 0 - 5 RBC/hpf   WBC, UA 0-5 0 - 5 WBC/hpf   Bacteria, UA NONE SEEN NONE SEEN   Squamous Epithelial / LPF 11-20 0 - 5   Mucus PRESENT     MDM FHT present via doppler  Patient actively vomiting in MAU. Given IV fluids,  zofran, & pepcid.  Reports resolution of pain & nausea since treatment. Able to tolerate ginger ale.  U/a negative for infection  Assessment and Plan   1. Gastroenteritis, acute   2. [redacted] weeks gestation of pregnancy    -rx zofran -bland diet -reviewed reasons to return to [place] 01/01/2022, 3:38 PM

## 2022-01-22 ENCOUNTER — Other Ambulatory Visit: Payer: Self-pay

## 2022-01-22 ENCOUNTER — Other Ambulatory Visit: Payer: Self-pay | Admitting: *Deleted

## 2022-01-22 ENCOUNTER — Ambulatory Visit: Payer: No Typology Code available for payment source | Attending: Obstetrics | Admitting: Obstetrics

## 2022-01-22 ENCOUNTER — Ambulatory Visit: Payer: Medicaid Other | Attending: Obstetrics and Gynecology

## 2022-01-22 ENCOUNTER — Encounter: Payer: Self-pay | Admitting: Family Medicine

## 2022-01-22 ENCOUNTER — Encounter: Payer: Self-pay | Admitting: *Deleted

## 2022-01-22 ENCOUNTER — Other Ambulatory Visit: Payer: Self-pay | Admitting: Obstetrics

## 2022-01-22 ENCOUNTER — Ambulatory Visit: Payer: Medicaid Other | Admitting: *Deleted

## 2022-01-22 VITALS — BP 109/46 | HR 79

## 2022-01-22 DIAGNOSIS — O365920 Maternal care for other known or suspected poor fetal growth, second trimester, not applicable or unspecified: Secondary | ICD-10-CM

## 2022-01-22 DIAGNOSIS — Z3689 Encounter for other specified antenatal screening: Secondary | ICD-10-CM

## 2022-01-22 DIAGNOSIS — O365990 Maternal care for other known or suspected poor fetal growth, unspecified trimester, not applicable or unspecified: Secondary | ICD-10-CM | POA: Insufficient documentation

## 2022-01-22 DIAGNOSIS — Z3A24 24 weeks gestation of pregnancy: Secondary | ICD-10-CM | POA: Diagnosis not present

## 2022-01-22 DIAGNOSIS — O359XX0 Maternal care for (suspected) fetal abnormality and damage, unspecified, not applicable or unspecified: Secondary | ICD-10-CM

## 2022-01-22 NOTE — Progress Notes (Signed)
MFM Note ? ?Mariah Haas was seen for a follow up growth scan as the overall EFW measured at the 5th percentile during her fetal anatomy scan.  The patient reports that her [facility] of May 09, 2022 is based on a certain LMP.  She denies any problems since her last exam and reports feeling vigorous fetal movements throughout the day.   ? ?On today's exam, the EFW measures at the 3rd percentile (EFW 1 pound 4 ounces) for her gestational age indicating fetal growth restriction.  The fetus has grown 11 ounces over the past 5 weeks.  There was normal amniotic fluid noted.   ? ?Fetal movements were noted throughout today's exam. ? ?Doppler studies of the umbilical arteries showed a normal S/D ratio of 3.6.  There were no signs of absent or reversed end-diastolic flow.   ? ?The following were discussed today: ? ?IUGR in pregnancy ? ?The implications and management of fetal growth restriction was discussed with the patient and her partner.  They were advised regarding the increased risk of an adverse pregnancy outcome such as stillbirth associated with IUGR, especially if the umbilical artery Doppler studies are abnormal.   ? ?The increased risk of preeclampsia due to early onset IUGR was discussed. She was advised to start taking a daily baby aspirin (81 mg) daily for preeclampsia prophylaxis.   ? ?They were also reassured that fetal growth restriction is a common finding and that most cases result in the delivery of a healthy infant close to term. ? ?Due to fetal growth restriction, another umbilical artery Doppler study was scheduled in 2 weeks.  We will reassess the fetal growth in 3 weeks. ? ?The couple were advised that we will continue to follow her closely with serial ultrasound exams throughout her pregnancy.  As long as the umbilical artery Doppler studies continue to be normal, the fetal testing remains reassuring, and the fetus continues to show growth, we will try to delay delivery until she reaches a more  optimal gestational age.   ? ?At the end of the consultation, the patient stated that all of her questions have been answered to her complete satisfaction.   ? ?She will return in 2 weeks for another umbilical artery Doppler study. ? ?A total of 20 minutes was spent counseling and coordinating the care for this patient.  Greater than 50% of the time was spent in direct face-to-face contact.   ?

## 2022-01-23 ENCOUNTER — Ambulatory Visit (INDEPENDENT_AMBULATORY_CARE_PROVIDER_SITE_OTHER): Payer: No Typology Code available for payment source | Admitting: Family Medicine

## 2022-01-23 VITALS — BP 115/77 | HR 116 | Wt 99.1 lb

## 2022-01-23 DIAGNOSIS — O09899 Supervision of other high risk pregnancies, unspecified trimester: Secondary | ICD-10-CM

## 2022-01-23 DIAGNOSIS — Z3480 Encounter for supervision of other normal pregnancy, unspecified trimester: Secondary | ICD-10-CM

## 2022-01-23 DIAGNOSIS — O365920 Maternal care for other known or suspected poor fetal growth, second trimester, not applicable or unspecified: Secondary | ICD-10-CM

## 2022-01-23 DIAGNOSIS — D563 Thalassemia minor: Secondary | ICD-10-CM

## 2022-01-23 DIAGNOSIS — Z2839 Other underimmunization status: Secondary | ICD-10-CM

## 2022-01-23 MED ORDER — ASPIRIN 81 MG PO CAPS: 1.0000 | ORAL_CAPSULE | Freq: Every day | ORAL | 11 refills | Status: DC

## 2022-01-23 NOTE — Progress Notes (Signed)
? ?  Subjective:  ?Mariah Haas is a 26 y.o. G1P0 at [redacted]w[redacted]d being seen today for ongoing prenatal care.  She is currently monitored for the following issues for this high-risk pregnancy and has Migraine without aura and without status migrainosus, not intractable; Allergic rhinitis due to allergen; Cyst of ovary; Mild intermittent asthma, uncomplicated; Seasonal and perennial allergic rhinitis; Supervision of other normal pregnancy, antepartum; Rubella non-immune status, antepartum; Alpha thalassemia silent carrier; and IUGR (intrauterine growth restriction) affecting care of mother on their problem list. ? ?Patient reports no complaints.  Contractions: Not present. Vag. Bleeding: None.  Movement: Present. Denies leaking of fluid.  ? ?The following portions of the patient's history were reviewed and updated as appropriate: allergies, current medications, past family history, past medical history, past social history, past surgical history and problem list. Problem list updated. ? ?Objective:  ? ?Vitals:  ? 01/23/22 1018  ?BP: 115/77  ?Pulse: (!) 116  ?Weight: 99 lb 1.6 oz (45 kg)  ? ? ?Fetal Status: Fetal Heart Rate (bpm): 156   Movement: Present    ? ?General:  Alert, oriented and cooperative. Patient is in no acute distress.  ?Skin: Skin is warm and dry. No rash noted.   ?Cardiovascular: Normal heart rate noted  ?Respiratory: Normal respiratory effort, no problems with respiration noted  ?Abdomen: Soft, gravid, appropriate for gestational age. Pain/Pressure: Absent     ?Pelvic: Vag. Bleeding: None     ?Cervical exam deferred        ?Extremities: Normal range of motion.  Edema: None  ?Mental Status: Normal mood and affect. Normal behavior. Normal judgment and thought content.  ? ?Urinalysis:     ? ?Assessment and Plan:  ?Pregnancy: G1P0 at [redacted]w[redacted]d ? ?1. Supervision of other normal pregnancy, antepartum ?BP and FHR normal ?Discussed third trimester labs for next visit, needs to be fasting ?- Aspirin 81 MG CAPS;  Take 1 tablet by mouth daily.  Dispense: 30 capsule; Refill: 11 ? ?2. Poor fetal growth affecting management of mother in second trimester, single or unspecified fetus ?Repeat growth US yesterday showed IUGR with EFW 3%, AC 3.6%, normal cord dopplers ?Discussed rationale for increased monitoring and likely early IOL if still small closer to term, reassured patient we will monitor very closely and discussed kick counts to start around 28 weeks ?MFM recommend starting prenatal ASA, rx sent ?- Aspirin 81 MG CAPS; Take 1 tablet by mouth daily.  Dispense: 30 capsule; Refill: 11 ? ?3. Alpha thalassemia silent carrier ?Has partner testing kit, not yet sent in ? ?4. Rubella non-immune status, antepartum ?Offer MMR PP ? ?Preterm labor symptoms and general obstetric precautions including but not limited to vaginal bleeding, contractions, leaking of fluid and fetal movement were reviewed in detail with the patient. ?Please refer to After Visit Summary for other counseling recommendations.  ?Return in 4 weeks (on 02/20/2022) for Dyad patient, ob visit, HRC. ? ? ?Eckstat, Matthew M, MD ? ?

## 2022-01-23 NOTE — Progress Notes (Signed)
Patient in for routine prenatal visit, states that she had her [place] yesterday at MFM and would like to speak with provider regarding those results. Patient is concerned for size of baby and measuring small per MFM. Patient was told yesterday that she needed to be started on baby aspirin.  ? ?Reports good fetal movement, no other issues or concerns at this time. ? ?Wynona Canes, CMA ? ?

## 2022-01-23 NOTE — Patient Instructions (Signed)

## 2022-02-01 ENCOUNTER — Encounter: Payer: Self-pay | Admitting: Family Medicine

## 2022-02-06 ENCOUNTER — Ambulatory Visit: Payer: Medicaid Other | Attending: Obstetrics

## 2022-02-06 ENCOUNTER — Ambulatory Visit (HOSPITAL_BASED_OUTPATIENT_CLINIC_OR_DEPARTMENT_OTHER): Payer: Medicaid Other | Admitting: *Deleted

## 2022-02-06 ENCOUNTER — Other Ambulatory Visit: Payer: Self-pay

## 2022-02-06 ENCOUNTER — Encounter: Payer: Self-pay | Admitting: *Deleted

## 2022-02-06 ENCOUNTER — Ambulatory Visit: Payer: Medicaid Other | Admitting: *Deleted

## 2022-02-06 VITALS — BP 113/63 | HR 76

## 2022-02-06 DIAGNOSIS — O365920 Maternal care for other known or suspected poor fetal growth, second trimester, not applicable or unspecified: Secondary | ICD-10-CM

## 2022-02-06 DIAGNOSIS — O365990 Maternal care for other known or suspected poor fetal growth, unspecified trimester, not applicable or unspecified: Secondary | ICD-10-CM | POA: Insufficient documentation

## 2022-02-06 DIAGNOSIS — Z3A26 26 weeks gestation of pregnancy: Secondary | ICD-10-CM | POA: Diagnosis not present

## 2022-02-06 DIAGNOSIS — O359XX0 Maternal care for (suspected) fetal abnormality and damage, unspecified, not applicable or unspecified: Secondary | ICD-10-CM | POA: Diagnosis not present

## 2022-02-06 NOTE — Procedures (Signed)
Tereka N Chick ?[birth year] ?[redacted]w[redacted]d ? ?Fetus A Non-Stress Test Interpretation for 02/06/22 ? ?Indication: IUGR ? ?Fetal Heart Rate A ?Mode: External ?Baseline Rate (A): 145 bpm ?Variability: Moderate ?Accelerations: 10 x 10 ?Decelerations: None ?Multiple birth?: No ? ?Uterine Activity ?Mode: Toco ?Contraction Frequency (min): none ?Resting Tone Palpated: Relaxed ? ?Interpretation (Fetal Testing) ?Nonstress Test Interpretation: Reactive ?Overall Impression: Reassuring for gestational age ?Comments: tracing reviewed by Dr. Gertie Exon ? ? ?

## 2022-02-13 ENCOUNTER — Ambulatory Visit: Payer: No Typology Code available for payment source | Admitting: *Deleted

## 2022-02-13 ENCOUNTER — Other Ambulatory Visit: Payer: Self-pay | Admitting: *Deleted

## 2022-02-13 ENCOUNTER — Other Ambulatory Visit: Payer: Self-pay

## 2022-02-13 ENCOUNTER — Encounter: Payer: Self-pay | Admitting: *Deleted

## 2022-02-13 ENCOUNTER — Ambulatory Visit: Payer: No Typology Code available for payment source | Attending: Obstetrics

## 2022-02-13 VITALS — BP 109/56 | HR 84

## 2022-02-13 DIAGNOSIS — Z3A27 27 weeks gestation of pregnancy: Secondary | ICD-10-CM

## 2022-02-13 DIAGNOSIS — O365920 Maternal care for other known or suspected poor fetal growth, second trimester, not applicable or unspecified: Secondary | ICD-10-CM | POA: Diagnosis present

## 2022-02-13 DIAGNOSIS — Z362 Encounter for other antenatal screening follow-up: Secondary | ICD-10-CM | POA: Diagnosis not present

## 2022-02-13 DIAGNOSIS — D563 Thalassemia minor: Secondary | ICD-10-CM

## 2022-02-13 DIAGNOSIS — O365921 Maternal care for other known or suspected poor fetal growth, second trimester, fetus 1: Secondary | ICD-10-CM

## 2022-02-15 ENCOUNTER — Other Ambulatory Visit: Payer: Self-pay | Admitting: General Practice

## 2022-02-15 ENCOUNTER — Other Ambulatory Visit: Payer: No Typology Code available for payment source

## 2022-02-15 DIAGNOSIS — O09899 Supervision of other high risk pregnancies, unspecified trimester: Secondary | ICD-10-CM

## 2022-02-15 DIAGNOSIS — Z3480 Encounter for supervision of other normal pregnancy, unspecified trimester: Secondary | ICD-10-CM

## 2022-02-16 LAB — CBC
Hematocrit: 29.9 % — ABNORMAL LOW (ref 34.0–46.6)
Hemoglobin: 9.9 g/dL — ABNORMAL LOW (ref 11.1–15.9)
MCH: 28.1 pg (ref 26.6–33.0)
MCHC: 33.1 g/dL (ref 31.5–35.7)
MCV: 85 fL (ref 79–97)
Platelets: 289 10*3/uL (ref 150–450)
RBC: 3.52 x10E6/uL — ABNORMAL LOW (ref 3.77–5.28)
RDW: 11.2 % — ABNORMAL LOW (ref 11.7–15.4)
WBC: 8.4 10*3/uL (ref 3.4–10.8)

## 2022-02-16 LAB — RPR: RPR Ser Ql: NONREACTIVE

## 2022-02-16 LAB — HIV ANTIBODY (ROUTINE TESTING W REFLEX): HIV Screen 4th Generation wRfx: NONREACTIVE

## 2022-02-19 ENCOUNTER — Other Ambulatory Visit: Payer: Self-pay

## 2022-02-19 ENCOUNTER — Encounter: Payer: Self-pay | Admitting: Family Medicine

## 2022-02-19 DIAGNOSIS — Z3480 Encounter for supervision of other normal pregnancy, unspecified trimester: Secondary | ICD-10-CM

## 2022-02-21 ENCOUNTER — Other Ambulatory Visit: Payer: No Typology Code available for payment source

## 2022-02-21 DIAGNOSIS — Z3480 Encounter for supervision of other normal pregnancy, unspecified trimester: Secondary | ICD-10-CM

## 2022-02-22 LAB — GLUCOSE TOLERANCE, 2 HOURS W/ 1HR
Glucose, 1 hour: 143 mg/dL (ref 70–179)
Glucose, 2 hour: 123 mg/dL (ref 70–152)
Glucose, Fasting: 78 mg/dL (ref 70–91)

## 2022-02-22 NOTE — Progress Notes (Signed)
? ? ?  PRENATAL VISIT NOTE ? ?Subjective:  ?Mariah Haas is a 26 y.o. G1P0 at 40w5dbeing seen today for ongoing prenatal care.  She is currently monitored for the following issues for this low-risk pregnancy and has Migraine without aura and without status migrainosus, not intractable; Allergic rhinitis due to allergen; Cyst of ovary; Mild intermittent asthma, uncomplicated; Seasonal and perennial allergic rhinitis; Supervision of other normal pregnancy, antepartum; Rubella non-immune status, antepartum; Alpha thalassemia silent carrier; and IUGR (intrauterine growth restriction) affecting care of mother on their problem list. ? ?Patient reports no complaints.  Contractions: Irritability. Vag. Bleeding: None.  Movement: Present. Denies leaking of fluid.  ? ?The following portions of the patient's history were reviewed and updated as appropriate: allergies, current medications, past family history, past medical history, past social history, past surgical history and problem list.  ? ?Objective:  ? ?Vitals:  ? 02/26/22 1028  ?BP: 114/76  ?Pulse: 91  ?Weight: 104 lb 12.8 oz (47.5 kg)  ? ? ?Fetal Status: Fetal Heart Rate (bpm): 144 Fundal Height: 27 cm Movement: Present    ? ?General:  Alert, oriented and cooperative. Patient is in no acute distress.  ?Skin: Skin is warm and dry. No rash noted.   ?Cardiovascular: Normal heart rate noted  ?Respiratory: Normal respiratory effort, no problems with respiration noted  ?Abdomen: Soft, gravid, appropriate for gestational age.  Pain/Pressure: Present     ?Pelvic: Cervical exam deferred        ?Extremities: Normal range of motion.  Edema: None  ?Mental Status: Normal mood and affect. Normal behavior. Normal judgment and thought content.  ? ?Assessment and Plan:  ?Pregnancy: G1P0 at [redacted]w[redacted]d? ?1. Supervision of other normal pregnancy, antepartum ?Up to date ?Had hemorrhoids- given resources for stool softeners ?- Tdap vaccine greater than or equal to 7yo IM ? ?2. Poor fetal  growth affecting management of mother in third trimester, single or unspecified fetus ?Has [place]ore[place]uled ? ?3. Rubella non-immune status, antepartum ?MMR ? ?Preterm labor symptoms and general obstetric precautions including but not limited to vaginal bleeding, contractions, leaking of fluid and fetal movement were reviewed in detail with the patient. ?Please refer to After Visit Summary for other counseling recommendations.  ? ?Return in about 2 weeks (around 03/12/2022) for Mom+Baby Combined Care. ? ?Future Appointments  ?Date Time Provider [place]?03/01/2022  2:00 PM [facility] NURSE [facility]  ?03/01/2022  2:15 PM [facility] NST [facility]  ?03/01/2022  3:30 PM [facility] US3 [facility] [place]?03/07/2022  3:30 PM [facility] NURSE [facility]  ?03/07/2022  3:45 PM [facility] US1 [facility]  ?03/13/2022  2:55 PM EcDione PloverMaAnnice NeedyMD [facility]?03/28/2022 11:15 AM EcClarnce FlockMD [facility]?04/11/2022  2:55 PM EcDione PloverMaAnnice NeedyMD [facility]? ? ?KiCaren MacadamMD ?

## 2022-02-26 ENCOUNTER — Other Ambulatory Visit: Payer: No Typology Code available for payment source

## 2022-02-26 ENCOUNTER — Ambulatory Visit (INDEPENDENT_AMBULATORY_CARE_PROVIDER_SITE_OTHER): Payer: No Typology Code available for payment source | Admitting: Family Medicine

## 2022-02-26 VITALS — BP 114/76 | HR 91 | Wt 104.8 lb

## 2022-02-26 DIAGNOSIS — Z23 Encounter for immunization: Secondary | ICD-10-CM | POA: Diagnosis not present

## 2022-02-26 DIAGNOSIS — O09899 Supervision of other high risk pregnancies, unspecified trimester: Secondary | ICD-10-CM

## 2022-02-26 DIAGNOSIS — Z2839 Other underimmunization status: Secondary | ICD-10-CM

## 2022-02-26 DIAGNOSIS — O365930 Maternal care for other known or suspected poor fetal growth, third trimester, not applicable or unspecified: Secondary | ICD-10-CM

## 2022-02-26 DIAGNOSIS — Z3480 Encounter for supervision of other normal pregnancy, unspecified trimester: Secondary | ICD-10-CM | POA: Diagnosis not present

## 2022-02-26 NOTE — Patient Instructions (Signed)
You have constipation which is hard stools that are difficult to pass. It is important to have regular bowel movements every 1-3 days that are soft and easy to pass. Hard stools increase your risk of hemorrhoids and are very uncomfortable.  ? ?To prevent constipation you can increase the amount of fiber in your diet. Examples of foods with fiber are leafy greens, whole grain breads, oatmeal and other grains.  It is also important to drink at least eight 8oz glass of water everyday.  ? ?If you have not has a bowel movement in 4-5 days you made need to clean out your bowel.  This will have establish normal movement through your bowel.   ? ?Miralax Clean out ?Take 8 capfuls of miralax in 64 oz of gatorade. You can use any fluid that appeals to you (gatorade, water, juice) ?Continue to drink at least eight 8 oz glasses of water throughout the day ?You can repeat with another 8 capfuls of miralax in 64 oz of gatorade if you are not having a large amount of stools ?You will need to be at home and close to a bathroom for about 8 hours when you do the above as you may need to go to the bathroom frequently.  ? ?After you are cleaned out: ?- Start Colace100mg  twice daily ?- Start Miralax once daily ?- Start a daily fiber supplement like metamucil or citrucel ?- You can safely use enemas in pregnancy  ?- if you are having diarrhea you can reduce to Colace once a day or miralax every other day or a 1/2 capful daily.  ? ?----------------------------- ? ?Childbirth Education! ? ?We recommend childbirth education to help your cope and plan for labor.  ? ?[place] has free or nearly free classes that you are able to sign up online. They have a combination of virtual and in person classes. Additionally they offer breastfeeding specific classes, infant CPR classes and classes for partners and grandparents. The classes are held on the [place] and [place] campuses. ? ? ?Childbirth Education Options: ? ?[facility] Childbirth Education: ?Classes can vary in availability and schedule is subject to change. For most up-to-date information please visit [email or web address] to review and register.  ? ?[email or web address]/ ? ? ?There are also many childbirth education books. The book entitled "The Birth Partner"  by Michael Litter is a text that is used to train doulas (trained birth support persons) and is a good Theatre stage manager for all families.  ? ?

## 2022-03-01 ENCOUNTER — Ambulatory Visit: Payer: No Typology Code available for payment source | Admitting: *Deleted

## 2022-03-01 ENCOUNTER — Ambulatory Visit (HOSPITAL_BASED_OUTPATIENT_CLINIC_OR_DEPARTMENT_OTHER): Payer: No Typology Code available for payment source | Admitting: *Deleted

## 2022-03-01 ENCOUNTER — Ambulatory Visit: Payer: No Typology Code available for payment source | Attending: Obstetrics

## 2022-03-01 VITALS — BP 112/61 | HR 86

## 2022-03-01 DIAGNOSIS — O365921 Maternal care for other known or suspected poor fetal growth, second trimester, fetus 1: Secondary | ICD-10-CM | POA: Diagnosis present

## 2022-03-01 DIAGNOSIS — O285 Abnormal chromosomal and genetic finding on antenatal screening of mother: Secondary | ICD-10-CM

## 2022-03-01 DIAGNOSIS — D563 Thalassemia minor: Secondary | ICD-10-CM | POA: Diagnosis not present

## 2022-03-01 DIAGNOSIS — Z3A30 30 weeks gestation of pregnancy: Secondary | ICD-10-CM

## 2022-03-01 DIAGNOSIS — O365930 Maternal care for other known or suspected poor fetal growth, third trimester, not applicable or unspecified: Secondary | ICD-10-CM

## 2022-03-01 DIAGNOSIS — O365990 Maternal care for other known or suspected poor fetal growth, unspecified trimester, not applicable or unspecified: Secondary | ICD-10-CM

## 2022-03-01 NOTE — Procedures (Signed)
Mariah Haas ?12/10/1995 ?[redacted]w[redacted]d ? ?Fetus A Non-Stress Test Interpretation for 03/01/22 ? ?Indication: IUGR ? ?Fetal Heart Rate A ?Mode: External ?Baseline Rate (A): 150 bpm ?Variability: Moderate ?Accelerations: 10 x 10 ?Decelerations: None ?Multiple birth?: No ? ?Uterine Activity ?Mode: Palpation, Toco ?Contraction Frequency (min): 1 uc ?Contraction Duration (sec): 50 ?Contraction Quality: Mild ?Resting Tone Palpated: Relaxed ?Resting Time: Adequate ? ?Interpretation (Fetal Testing) ?Nonstress Test Interpretation: Reactive ?Overall Impression: Reassuring for gestational age ?Comments: Dr. Grace Bushy reviewed tracing ? ? ?

## 2022-03-07 ENCOUNTER — Ambulatory Visit: Payer: No Typology Code available for payment source | Admitting: *Deleted

## 2022-03-07 ENCOUNTER — Ambulatory Visit: Payer: No Typology Code available for payment source | Attending: Obstetrics

## 2022-03-07 VITALS — BP 117/59 | HR 79

## 2022-03-07 DIAGNOSIS — O285 Abnormal chromosomal and genetic finding on antenatal screening of mother: Secondary | ICD-10-CM

## 2022-03-07 DIAGNOSIS — O365931 Maternal care for other known or suspected poor fetal growth, third trimester, fetus 1: Secondary | ICD-10-CM | POA: Insufficient documentation

## 2022-03-07 DIAGNOSIS — O365930 Maternal care for other known or suspected poor fetal growth, third trimester, not applicable or unspecified: Secondary | ICD-10-CM

## 2022-03-07 DIAGNOSIS — D563 Thalassemia minor: Secondary | ICD-10-CM | POA: Insufficient documentation

## 2022-03-07 DIAGNOSIS — O365921 Maternal care for other known or suspected poor fetal growth, second trimester, fetus 1: Secondary | ICD-10-CM | POA: Diagnosis not present

## 2022-03-07 DIAGNOSIS — Z3A31 31 weeks gestation of pregnancy: Secondary | ICD-10-CM

## 2022-03-08 ENCOUNTER — Other Ambulatory Visit: Payer: Self-pay | Admitting: *Deleted

## 2022-03-08 DIAGNOSIS — O365990 Maternal care for other known or suspected poor fetal growth, unspecified trimester, not applicable or unspecified: Secondary | ICD-10-CM

## 2022-03-08 DIAGNOSIS — Z3689 Encounter for other specified antenatal screening: Secondary | ICD-10-CM

## 2022-03-08 NOTE — Progress Notes (Unsigned)
Us/

## 2022-03-13 ENCOUNTER — Ambulatory Visit (INDEPENDENT_AMBULATORY_CARE_PROVIDER_SITE_OTHER): Payer: No Typology Code available for payment source | Admitting: Family Medicine

## 2022-03-13 ENCOUNTER — Encounter: Payer: Self-pay | Admitting: Family Medicine

## 2022-03-13 VITALS — BP 112/69 | HR 76 | Wt 106.1 lb

## 2022-03-13 DIAGNOSIS — O365930 Maternal care for other known or suspected poor fetal growth, third trimester, not applicable or unspecified: Secondary | ICD-10-CM

## 2022-03-13 DIAGNOSIS — O99013 Anemia complicating pregnancy, third trimester: Secondary | ICD-10-CM

## 2022-03-13 DIAGNOSIS — Z2839 Other underimmunization status: Secondary | ICD-10-CM

## 2022-03-13 DIAGNOSIS — O99019 Anemia complicating pregnancy, unspecified trimester: Secondary | ICD-10-CM | POA: Insufficient documentation

## 2022-03-13 DIAGNOSIS — D509 Iron deficiency anemia, unspecified: Secondary | ICD-10-CM | POA: Insufficient documentation

## 2022-03-13 DIAGNOSIS — O09899 Supervision of other high risk pregnancies, unspecified trimester: Secondary | ICD-10-CM

## 2022-03-13 DIAGNOSIS — Z3480 Encounter for supervision of other normal pregnancy, unspecified trimester: Secondary | ICD-10-CM

## 2022-03-13 DIAGNOSIS — D563 Thalassemia minor: Secondary | ICD-10-CM

## 2022-03-13 MED ORDER — FERROUS SULFATE 325 (65 FE) MG PO TBEC: 325.0000 mg | DELAYED_RELEASE_TABLET | ORAL | 2 refills | Status: DC

## 2022-03-13 NOTE — Patient Instructions (Signed)

## 2022-03-13 NOTE — Progress Notes (Signed)
? ?  Subjective:  ?Mariah Haas is a 26 y.o. G1P0 at [redacted]w[redacted]d being seen today for ongoing prenatal care.  She is currently monitored for the following issues for this high-risk pregnancy and has Migraine without aura and without status migrainosus, not intractable; Allergic rhinitis due to allergen; Cyst of ovary; Mild intermittent asthma, uncomplicated; Seasonal and perennial allergic rhinitis; Supervision of other normal pregnancy, antepartum; Rubella non-immune status, antepartum; Alpha thalassemia silent carrier; and IUGR (intrauterine growth restriction) affecting care of mother on their problem list. ? ?Patient reports no complaints.  Contractions: Irritability. Vag. Bleeding: None.  Movement: Present. Denies leaking of fluid.  ? ?The following portions of the patient's history were reviewed and updated as appropriate: allergies, current medications, past family history, past medical history, past social history, past surgical history and problem list. Problem list updated. ? ?Objective:  ? ?Vitals:  ? 03/13/22 1503  ?BP: 112/69  ?Pulse: 76  ?Weight: 106 lb 1.6 oz (48.1 kg)  ? ? ?Fetal Status: Fetal Heart Rate (bpm): 140   Movement: Present    ? ?General:  Alert, oriented and cooperative. Patient is in no acute distress.  ?Skin: Skin is warm and dry. No rash noted.   ?Cardiovascular: Normal heart rate noted  ?Respiratory: Normal respiratory effort, no problems with respiration noted  ?Abdomen: Soft, gravid, appropriate for gestational age. Pain/Pressure: Absent     ?Pelvic: Vag. Bleeding: None     ?Cervical exam deferred        ?Extremities: Normal range of motion.     ?Mental Status: Normal mood and affect. Normal behavior. Normal judgment and thought content.  ? ?Urinalysis:     ? ?Assessment and Plan:  ?Pregnancy: G1P0 at [redacted]w[redacted]d ? ?1. Supervision of other normal pregnancy, antepartum ?BP and FHR normal ?Some tingling in legs, bilateral, no swelling, discussed this is normal ? ?2. Rubella non-immune  status, antepartum ?Offer MMR PP ? ?3. Poor fetal growth affecting management of mother in third trimester, single or unspecified fetus ?Most recent growth US showed EFW 13%, continues to follow w MFM ? ?4. Alpha thalassemia silent carrier ?S/p kit ? ?5. Anemia of pregnancy ?Start PO iron ? ?Preterm labor symptoms and general obstetric precautions including but not limited to vaginal bleeding, contractions, leaking of fluid and fetal movement were reviewed in detail with the patient. ?Please refer to After Visit Summary for other counseling recommendations.  ?Return in 2 weeks (on 03/27/2022) for Dyad patient, ob visit. ? ? ?Clarnce Flock, MD ? ?

## 2022-03-28 ENCOUNTER — Other Ambulatory Visit: Payer: Self-pay | Admitting: *Deleted

## 2022-03-28 ENCOUNTER — Ambulatory Visit: Payer: No Typology Code available for payment source | Admitting: *Deleted

## 2022-03-28 ENCOUNTER — Encounter: Payer: Self-pay | Admitting: Family Medicine

## 2022-03-28 ENCOUNTER — Ambulatory Visit: Payer: No Typology Code available for payment source | Attending: Maternal & Fetal Medicine

## 2022-03-28 ENCOUNTER — Encounter: Payer: Self-pay | Admitting: *Deleted

## 2022-03-28 ENCOUNTER — Ambulatory Visit (INDEPENDENT_AMBULATORY_CARE_PROVIDER_SITE_OTHER): Payer: No Typology Code available for payment source | Admitting: Family Medicine

## 2022-03-28 VITALS — BP 119/65 | HR 86

## 2022-03-28 VITALS — BP 112/74 | HR 77 | Wt 108.2 lb

## 2022-03-28 DIAGNOSIS — O285 Abnormal chromosomal and genetic finding on antenatal screening of mother: Secondary | ICD-10-CM

## 2022-03-28 DIAGNOSIS — Z3689 Encounter for other specified antenatal screening: Secondary | ICD-10-CM

## 2022-03-28 DIAGNOSIS — D563 Thalassemia minor: Secondary | ICD-10-CM | POA: Diagnosis not present

## 2022-03-28 DIAGNOSIS — O99013 Anemia complicating pregnancy, third trimester: Secondary | ICD-10-CM

## 2022-03-28 DIAGNOSIS — Z3A34 34 weeks gestation of pregnancy: Secondary | ICD-10-CM

## 2022-03-28 DIAGNOSIS — Z3480 Encounter for supervision of other normal pregnancy, unspecified trimester: Secondary | ICD-10-CM

## 2022-03-28 DIAGNOSIS — O365930 Maternal care for other known or suspected poor fetal growth, third trimester, not applicable or unspecified: Secondary | ICD-10-CM | POA: Insufficient documentation

## 2022-03-28 DIAGNOSIS — O365990 Maternal care for other known or suspected poor fetal growth, unspecified trimester, not applicable or unspecified: Secondary | ICD-10-CM

## 2022-03-28 DIAGNOSIS — O09899 Supervision of other high risk pregnancies, unspecified trimester: Secondary | ICD-10-CM

## 2022-03-28 DIAGNOSIS — Z2839 Other underimmunization status: Secondary | ICD-10-CM

## 2022-03-28 NOTE — Progress Notes (Signed)
? ?  Subjective:  ?Mariah Haas is a 26 y.o. G1P0 at [redacted]w[redacted]d being seen today for ongoing prenatal care.  She is currently monitored for the following issues for this high-risk pregnancy and has Migraine without aura and without status migrainosus, not intractable; Allergic rhinitis due to allergen; Cyst of ovary; Mild intermittent asthma, uncomplicated; Seasonal and perennial allergic rhinitis; Supervision of other normal pregnancy, antepartum; Rubella non-immune status, antepartum; Alpha thalassemia silent carrier; IUGR (intrauterine growth restriction) affecting care of mother; and Anemia of pregnancy on their problem list. ? ?Patient reports no complaints.  Contractions: Irritability. Vag. Bleeding: None.  Movement: Present. Denies leaking of fluid.  ? ?The following portions of the patient's history were reviewed and updated as appropriate: allergies, current medications, past family history, past medical history, past social history, past surgical history and problem list. Problem list updated. ? ?Objective:  ? ?Vitals:  ? 03/28/22 1121  ?BP: 112/74  ?Pulse: 77  ?Weight: 108 lb 3.2 oz (49.1 kg)  ? ? ?Fetal Status: Fetal Heart Rate (bpm): 154   Movement: Present    ? ?General:  Alert, oriented and cooperative. Patient is in no acute distress.  ?Skin: Skin is warm and dry. No rash noted.   ?Cardiovascular: Normal heart rate noted  ?Respiratory: Normal respiratory effort, no problems with respiration noted  ?Abdomen: Soft, gravid, appropriate for gestational age. Pain/Pressure: Present     ?Pelvic: Vag. Bleeding: None     ?Cervical exam deferred        ?Extremities: Normal range of motion.     ?Mental Status: Normal mood and affect. Normal behavior. Normal judgment and thought content.  ? ?Urinalysis:     ? ?Assessment and Plan:  ?Pregnancy: G1P0 at [redacted]w[redacted]d ? ?1. Supervision of other normal pregnancy, antepartum ?BP and FHR normal ?Discussed contraception, considering LARCs ? ?2. Rubella non-immune status,  antepartum ?Offer MMR PP ? ?3. Poor fetal growth affecting management of mother in third trimester, single or unspecified fetus ?Growth [place] today showed EFW 12%, AC 13%, AFI 12 ?Has follow up scan in 3 weeks ? ?4. Anemia during pregnancy in third trimester ?On PO iron ?Lab Results  ?Component Value Date  ? HGB 9.9 (L) 02/15/2022  ? ? ?5. Alpha thalassemia silent carrier ?S/p kit ? ?Preterm labor symptoms and general obstetric precautions including but not limited to vaginal bleeding, contractions, leaking of fluid and fetal movement were reviewed in detail with the patient. ?Please refer to After Visit Summary for other counseling recommendations.  ?Return in about 2 weeks (around 04/11/2022) for Dyad patient, ob visit. ? ? ?Clarnce Flock, MD ? ?

## 2022-04-11 ENCOUNTER — Other Ambulatory Visit (HOSPITAL_COMMUNITY)
Admission: RE | Admit: 2022-04-11 | Discharge: 2022-04-11 | Disposition: A | Discharge status: Home / Self Care | Payer: No Typology Code available for payment source | Source: Ambulatory Visit | Attending: Family Medicine | Admitting: Family Medicine

## 2022-04-11 ENCOUNTER — Encounter: Payer: Self-pay | Admitting: Family Medicine

## 2022-04-11 ENCOUNTER — Ambulatory Visit (INDEPENDENT_AMBULATORY_CARE_PROVIDER_SITE_OTHER): Payer: No Typology Code available for payment source | Admitting: Family Medicine

## 2022-04-11 VITALS — BP 119/77 | HR 81 | Wt 111.0 lb

## 2022-04-11 DIAGNOSIS — Z3480 Encounter for supervision of other normal pregnancy, unspecified trimester: Secondary | ICD-10-CM | POA: Diagnosis present

## 2022-04-11 DIAGNOSIS — Z2839 Other underimmunization status: Secondary | ICD-10-CM

## 2022-04-11 DIAGNOSIS — O09899 Supervision of other high risk pregnancies, unspecified trimester: Secondary | ICD-10-CM

## 2022-04-11 DIAGNOSIS — O99013 Anemia complicating pregnancy, third trimester: Secondary | ICD-10-CM

## 2022-04-11 DIAGNOSIS — O365930 Maternal care for other known or suspected poor fetal growth, third trimester, not applicable or unspecified: Secondary | ICD-10-CM

## 2022-04-11 NOTE — Patient Instructions (Signed)

## 2022-04-11 NOTE — Progress Notes (Signed)
Patient reports pelvic pressure along with daily contractions. Patient stated that she gets contractions roughly once a day. Denies vaginal bleeding or abnormal discharge

## 2022-04-11 NOTE — Progress Notes (Signed)
   Subjective:  Mariah Haas is a 26 y.o. G1P0 at [redacted]w[redacted]d being seen today for ongoing prenatal care.  She is currently monitored for the following issues for this low-risk pregnancy and has Migraine without aura and without status migrainosus, not intractable; Allergic rhinitis due to allergen; Cyst of ovary; Mild intermittent asthma, uncomplicated; Seasonal and perennial allergic rhinitis; Supervision of other normal pregnancy, antepartum; Rubella non-immune status, antepartum; Alpha thalassemia silent carrier; IUGR (intrauterine growth restriction) affecting care of mother; and Anemia of pregnancy on their problem list.  Patient reports no complaints.  Contractions: Irritability.  .  Movement: Present. Denies leaking of fluid.   The following portions of the patient's history were reviewed and updated as appropriate: allergies, current medications, past family history, past medical history, past social history, past surgical history and problem list. Problem list updated.  Objective:   Vitals:   04/11/22 1455  BP: 119/77  Pulse: 81  Weight: 111 lb (50.3 kg)    Fetal Status: Fetal Heart Rate (bpm): 131   Movement: Present  Presentation: Vertex  General:  Alert, oriented and cooperative. Patient is in no acute distress.  Skin: Skin is warm and dry. No rash noted.   Cardiovascular: Normal heart rate noted  Respiratory: Normal respiratory effort, no problems with respiration noted  Abdomen: Soft, gravid, appropriate for gestational age. Pain/Pressure: Present     Pelvic:       Cervical exam deferred Dilation: Closed Effacement (%): Thick Station: -3  Extremities: Normal range of motion.     Mental Status: Normal mood and affect. Normal behavior. Normal judgment and thought content.   Urinalysis:      Assessment and Plan:  Pregnancy: G1P0 at [redacted]w[redacted]d  1. Supervision of other normal pregnancy, antepartum BP and FHR normal Swabs today Has FMLA paperwork, will hand in at front -  Culture, beta strep (group b only) - Cervicovaginal ancillary only( [place])  2. Poor fetal growth affecting management of mother in third trimester, single or unspecified fetus Resolved on most recent scan. Has follow up in a few weeks  3. Rubella non-immune status, antepartum Offer MMR PP  4. Anemia during pregnancy in third trimester Lab Results  Component Value Date   HGB 9.9 (L) 02/15/2022    Preterm labor symptoms and general obstetric precautions including but not limited to vaginal bleeding, contractions, leaking of fluid and fetal movement were reviewed in detail with the patient. Please refer to After Visit Summary for other counseling recommendations.  Return in 1 week (on 04/18/2022) for Dyad patient, ob visit.   Clarnce Flock, MD

## 2022-04-13 LAB — CERVICOVAGINAL ANCILLARY ONLY
Chlamydia: NEGATIVE
Comment: NEGATIVE
Comment: NORMAL
Neisseria Gonorrhea: NEGATIVE

## 2022-04-14 LAB — CULTURE, BETA STREP (GROUP B ONLY): Strep Gp B Culture: NEGATIVE

## 2022-04-16 ENCOUNTER — Encounter: Payer: Self-pay | Admitting: Radiology

## 2022-04-18 ENCOUNTER — Ambulatory Visit (INDEPENDENT_AMBULATORY_CARE_PROVIDER_SITE_OTHER): Payer: No Typology Code available for payment source | Admitting: Family Medicine

## 2022-04-18 ENCOUNTER — Encounter: Payer: Self-pay | Admitting: Family Medicine

## 2022-04-18 VITALS — BP 107/68 | HR 77 | Wt 111.6 lb

## 2022-04-18 DIAGNOSIS — O09899 Supervision of other high risk pregnancies, unspecified trimester: Secondary | ICD-10-CM

## 2022-04-18 DIAGNOSIS — Z0289 Encounter for other administrative examinations: Secondary | ICD-10-CM

## 2022-04-18 DIAGNOSIS — Z3480 Encounter for supervision of other normal pregnancy, unspecified trimester: Secondary | ICD-10-CM

## 2022-04-18 DIAGNOSIS — Z2839 Other underimmunization status: Secondary | ICD-10-CM

## 2022-04-18 DIAGNOSIS — O365930 Maternal care for other known or suspected poor fetal growth, third trimester, not applicable or unspecified: Secondary | ICD-10-CM

## 2022-04-18 NOTE — Patient Instructions (Signed)

## 2022-04-18 NOTE — Progress Notes (Signed)
Subjective:  Mariah Haas is a 26 y.o. G1P0 at 76w0dbeing seen today for ongoing prenatal care.  She is currently monitored for the following issues for this low-risk pregnancy and has Migraine without aura and without status migrainosus, not intractable; Allergic rhinitis due to allergen; Cyst of ovary; Mild intermittent asthma, uncomplicated; Seasonal and perennial allergic rhinitis; Supervision of other normal pregnancy, antepartum; Rubella non-immune status, antepartum; Alpha thalassemia silent carrier; IUGR (intrauterine growth restriction) affecting care of mother; and Anemia of pregnancy on their problem list.  Patient reports no complaints.  Contractions: Irritability. Vag. Bleeding: None.  Movement: Present. Denies leaking of fluid.   The following portions of the patient's history were reviewed and updated as appropriate: allergies, current medications, past family history, past medical history, past social history, past surgical history and problem list. Problem list updated.  Objective:   Vitals:   04/18/22 1058  BP: 107/68  Pulse: 77  Weight: 111 lb 9.6 oz (50.6 kg)    Fetal Status: Fetal Heart Rate (bpm): 145   Movement: Present     General:  Alert, oriented and cooperative. Patient is in no acute distress.  Skin: Skin is warm and dry. No rash noted.   Cardiovascular: Normal heart rate noted  Respiratory: Normal respiratory effort, no problems with respiration noted  Abdomen: Soft, gravid, appropriate for gestational age. Pain/Pressure: Present     Pelvic: Vag. Bleeding: None     Cervical exam deferred        Extremities: Normal range of motion.     Mental Status: Normal mood and affect. Normal behavior. Normal judgment and thought content.   Urinalysis:      Assessment and Plan:  Pregnancy: G1P0 at 325w0d1. Supervision of other normal pregnancy, antepartum BP and FHR normal  2. Rubella non-immune status, antepartum Offer MMR PP  3. Poor fetal growth  affecting management of mother in third trimester, single or unspecified fetus Resolved on last [place] 03/28/2022 Has repeat scheduled for next week, will lookout for delivery recs from that report  Term labor symptoms and general obstetric precautions including but not limited to vaginal bleeding, contractions, leaking of fluid and fetal movement were reviewed in detail with the patient. Please refer to After Visit Summary for other counseling recommendations.  Return in 1 week (on 04/25/2022) for Dyad patient, ob visit.   EcClarnce FlockMD

## 2022-04-23 ENCOUNTER — Ambulatory Visit: Payer: No Typology Code available for payment source | Admitting: *Deleted

## 2022-04-23 ENCOUNTER — Ambulatory Visit: Payer: No Typology Code available for payment source | Attending: Maternal & Fetal Medicine

## 2022-04-23 VITALS — BP 113/63 | HR 77

## 2022-04-23 DIAGNOSIS — O285 Abnormal chromosomal and genetic finding on antenatal screening of mother: Secondary | ICD-10-CM | POA: Diagnosis not present

## 2022-04-23 DIAGNOSIS — D563 Thalassemia minor: Secondary | ICD-10-CM

## 2022-04-23 DIAGNOSIS — O365930 Maternal care for other known or suspected poor fetal growth, third trimester, not applicable or unspecified: Secondary | ICD-10-CM

## 2022-04-23 DIAGNOSIS — O365990 Maternal care for other known or suspected poor fetal growth, unspecified trimester, not applicable or unspecified: Secondary | ICD-10-CM | POA: Insufficient documentation

## 2022-04-23 DIAGNOSIS — Z3A37 37 weeks gestation of pregnancy: Secondary | ICD-10-CM | POA: Diagnosis not present

## 2022-04-25 ENCOUNTER — Ambulatory Visit (INDEPENDENT_AMBULATORY_CARE_PROVIDER_SITE_OTHER): Payer: No Typology Code available for payment source | Admitting: Family Medicine

## 2022-04-25 ENCOUNTER — Encounter: Payer: Self-pay | Admitting: Family Medicine

## 2022-04-25 VITALS — BP 108/71 | HR 81 | Wt 112.2 lb

## 2022-04-25 DIAGNOSIS — Z2839 Other underimmunization status: Secondary | ICD-10-CM

## 2022-04-25 DIAGNOSIS — O99013 Anemia complicating pregnancy, third trimester: Secondary | ICD-10-CM

## 2022-04-25 DIAGNOSIS — O09899 Supervision of other high risk pregnancies, unspecified trimester: Secondary | ICD-10-CM

## 2022-04-25 DIAGNOSIS — Z3480 Encounter for supervision of other normal pregnancy, unspecified trimester: Secondary | ICD-10-CM

## 2022-04-25 DIAGNOSIS — O365930 Maternal care for other known or suspected poor fetal growth, third trimester, not applicable or unspecified: Secondary | ICD-10-CM

## 2022-04-25 NOTE — Progress Notes (Signed)
   Subjective:  Mariah Haas is a 26 y.o. G1P0 at [redacted]w[redacted]d being seen today for ongoing prenatal care.  She is currently monitored for the following issues for this high-risk pregnancy and has Migraine without aura and without status migrainosus, not intractable; Allergic rhinitis due to allergen; Cyst of ovary; Mild intermittent asthma, uncomplicated; Seasonal and perennial allergic rhinitis; Supervision of other normal pregnancy, antepartum; Rubella non-immune status, antepartum; Alpha thalassemia silent carrier; IUGR (intrauterine growth restriction) affecting care of mother; and Anemia of pregnancy on their problem list.  Patient reports no complaints.  Contractions: Irritability. Vag. Bleeding: None.  Movement: Present. Denies leaking of fluid.   The following portions of the patient's history were reviewed and updated as appropriate: allergies, current medications, past family history, past medical history, past social history, past surgical history and problem list. Problem list updated.  Objective:   Vitals:   04/25/22 0934  BP: 108/71  Pulse: 81  Weight: 112 lb 3.2 oz (50.9 kg)    Fetal Status: Fetal Heart Rate (bpm): 145   Movement: Present     General:  Alert, oriented and cooperative. Patient is in no acute distress.  Skin: Skin is warm and dry. No rash noted.   Cardiovascular: Normal heart rate noted  Respiratory: Normal respiratory effort, no problems with respiration noted  Abdomen: Soft, gravid, appropriate for gestational age. Pain/Pressure: Present     Pelvic: Vag. Bleeding: None     Cervical exam deferred        Extremities: Normal range of motion.     Mental Status: Normal mood and affect. Normal behavior. Normal judgment and thought content.   Urinalysis:      Assessment and Plan:  Pregnancy: G1P0 at [redacted]w[redacted]d  1. Supervision of other normal pregnancy, antepartum BP and FHR normal  2. Poor fetal growth affecting management of mother in third trimester, single  or unspecified fetus Resolved on scan from two days prior, EFW 18% MFM still recommend delivery around due date, she would like IOL by then as well Form faxed, orders placed Offered outpatient foley afternoon prior, declines  3. Rubella non-immune status, antepartum Offer MMR PP  4. Anemia during pregnancy in third trimester Lab Results  Component Value Date   HGB 9.9 (L) 02/15/2022     Term labor symptoms and general obstetric precautions including but not limited to vaginal bleeding, contractions, leaking of fluid and fetal movement were reviewed in detail with the patient. Please refer to After Visit Summary for other counseling recommendations.  Return in about 1 week (around 05/02/2022) for Dyad patient, ob visit.   Clarnce Flock, MD

## 2022-04-26 ENCOUNTER — Encounter (HOSPITAL_COMMUNITY): Payer: Self-pay

## 2022-04-26 ENCOUNTER — Telehealth (HOSPITAL_COMMUNITY): Payer: Self-pay | Admitting: *Deleted

## 2022-04-26 NOTE — Telephone Encounter (Signed)
Preadmission screen  

## 2022-04-27 ENCOUNTER — Telehealth (HOSPITAL_COMMUNITY): Payer: Self-pay | Admitting: *Deleted

## 2022-04-27 ENCOUNTER — Encounter (HOSPITAL_COMMUNITY): Payer: Self-pay | Admitting: *Deleted

## 2022-04-27 NOTE — Telephone Encounter (Signed)
Preadmission screen  

## 2022-05-02 ENCOUNTER — Other Ambulatory Visit: Payer: Self-pay | Admitting: Advanced Practice Midwife

## 2022-05-02 ENCOUNTER — Encounter: Payer: Self-pay | Admitting: Family Medicine

## 2022-05-02 ENCOUNTER — Ambulatory Visit (INDEPENDENT_AMBULATORY_CARE_PROVIDER_SITE_OTHER): Payer: No Typology Code available for payment source | Admitting: Family Medicine

## 2022-05-02 VITALS — BP 111/72 | HR 75 | Wt 113.4 lb

## 2022-05-02 DIAGNOSIS — Z3480 Encounter for supervision of other normal pregnancy, unspecified trimester: Secondary | ICD-10-CM

## 2022-05-02 DIAGNOSIS — O09899 Supervision of other high risk pregnancies, unspecified trimester: Secondary | ICD-10-CM

## 2022-05-02 DIAGNOSIS — Z2839 Other underimmunization status: Secondary | ICD-10-CM

## 2022-05-02 DIAGNOSIS — O99013 Anemia complicating pregnancy, third trimester: Secondary | ICD-10-CM

## 2022-05-02 DIAGNOSIS — O365930 Maternal care for other known or suspected poor fetal growth, third trimester, not applicable or unspecified: Secondary | ICD-10-CM

## 2022-05-02 DIAGNOSIS — Z3A39 39 weeks gestation of pregnancy: Secondary | ICD-10-CM

## 2022-05-02 NOTE — Patient Instructions (Signed)

## 2022-05-02 NOTE — Progress Notes (Signed)
   Subjective:  Mariah Haas is a 26 y.o. G1P0 at [redacted]w[redacted]d being seen today for ongoing prenatal care.  She is currently monitored for the following issues for this high-risk pregnancy and has Migraine without aura and without status migrainosus, not intractable; Allergic rhinitis due to allergen; Cyst of ovary; Mild intermittent asthma, uncomplicated; Seasonal and perennial allergic rhinitis; Supervision of other normal pregnancy, antepartum; Rubella non-immune status, antepartum; Alpha thalassemia silent carrier; IUGR (intrauterine growth restriction) affecting care of mother; and Anemia of pregnancy on their problem list.  Patient reports no complaints.  Contractions: Irritability. Vag. Bleeding: None.  Movement: Present. Denies leaking of fluid.   The following portions of the patient's history were reviewed and updated as appropriate: allergies, current medications, past family history, past medical history, past social history, past surgical history and problem list. Problem list updated.  Objective:   Vitals:   05/02/22 1137  BP: 111/72  Pulse: 75  Weight: 113 lb 6.4 oz (51.4 kg)    Fetal Status: Fetal Heart Rate (bpm): 136   Movement: Present     General:  Alert, oriented and cooperative. Patient is in no acute distress.  Skin: Skin is warm and dry. No rash noted.   Cardiovascular: Normal heart rate noted  Respiratory: Normal respiratory effort, no problems with respiration noted  Abdomen: Soft, gravid, appropriate for gestational age. Pain/Pressure: Present     Pelvic: Vag. Bleeding: None     Cervical exam deferred        Extremities: Normal range of motion.  Edema: None  Mental Status: Normal mood and affect. Normal behavior. Normal judgment and thought content.   Urinalysis:      Assessment and Plan:  Pregnancy: G1P0 at [redacted]w[redacted]d  1. Supervision of other normal pregnancy, antepartum BP and FHR normal  2. Poor fetal growth affecting management of mother in third  trimester, single or unspecified fetus Resolved on scan from two days prior, EFW 18% MFM still recommend delivery around due date, she would like IOL by then as well Already scheduled for next week on 05/10/2022 Previously declined outpatient FB  3. Anemia during pregnancy in third trimester Lab Results  Component Value Date   HGB 9.9 (L) 02/15/2022    4. Rubella non-immune status, antepartum Offer MMR PP  Term labor symptoms and general obstetric precautions including but not limited to vaginal bleeding, contractions, leaking of fluid and fetal movement were reviewed in detail with the patient. Please refer to After Visit Summary for other counseling recommendations.  Return in 7 weeks (on 06/20/2022) for Dyad patient, PP check.   Clarnce Flock, MD

## 2022-05-09 ENCOUNTER — Encounter: Payer: No Typology Code available for payment source | Admitting: Family Medicine

## 2022-05-09 ENCOUNTER — Other Ambulatory Visit: Payer: No Typology Code available for payment source

## 2022-05-10 ENCOUNTER — Inpatient Hospital Stay (HOSPITAL_COMMUNITY): Payer: No Typology Code available for payment source | Admitting: Anesthesiology

## 2022-05-10 ENCOUNTER — Encounter (HOSPITAL_COMMUNITY): Payer: Self-pay | Admitting: Family Medicine

## 2022-05-10 ENCOUNTER — Inpatient Hospital Stay (HOSPITAL_COMMUNITY): Payer: No Typology Code available for payment source

## 2022-05-10 ENCOUNTER — Inpatient Hospital Stay (HOSPITAL_COMMUNITY)
Admission: AD | Admit: 2022-05-10 | Discharge: 2022-05-12 | DRG: 807 | Disposition: A | Discharge status: Home / Self Care | Payer: No Typology Code available for payment source | Attending: Family Medicine | Admitting: Family Medicine

## 2022-05-10 DIAGNOSIS — Z3A40 40 weeks gestation of pregnancy: Secondary | ICD-10-CM

## 2022-05-10 DIAGNOSIS — Z975 Presence of (intrauterine) contraceptive device: Secondary | ICD-10-CM

## 2022-05-10 DIAGNOSIS — O365930 Maternal care for other known or suspected poor fetal growth, third trimester, not applicable or unspecified: Secondary | ICD-10-CM | POA: Diagnosis present

## 2022-05-10 DIAGNOSIS — D563 Thalassemia minor: Secondary | ICD-10-CM | POA: Diagnosis present

## 2022-05-10 DIAGNOSIS — O480 Post-term pregnancy: Secondary | ICD-10-CM | POA: Diagnosis present

## 2022-05-10 DIAGNOSIS — Z23 Encounter for immunization: Secondary | ICD-10-CM

## 2022-05-10 DIAGNOSIS — O9952 Diseases of the respiratory system complicating childbirth: Secondary | ICD-10-CM | POA: Diagnosis present

## 2022-05-10 DIAGNOSIS — Z30430 Encounter for insertion of intrauterine contraceptive device: Secondary | ICD-10-CM

## 2022-05-10 DIAGNOSIS — Z3480 Encounter for supervision of other normal pregnancy, unspecified trimester: Secondary | ICD-10-CM

## 2022-05-10 DIAGNOSIS — D509 Iron deficiency anemia, unspecified: Secondary | ICD-10-CM | POA: Diagnosis present

## 2022-05-10 DIAGNOSIS — O365990 Maternal care for other known or suspected poor fetal growth, unspecified trimester, not applicable or unspecified: Secondary | ICD-10-CM | POA: Diagnosis present

## 2022-05-10 DIAGNOSIS — O322XX0 Maternal care for transverse and oblique lie, not applicable or unspecified: Secondary | ICD-10-CM | POA: Diagnosis present

## 2022-05-10 DIAGNOSIS — J4520 Mild intermittent asthma, uncomplicated: Secondary | ICD-10-CM | POA: Diagnosis present

## 2022-05-10 DIAGNOSIS — O09899 Supervision of other high risk pregnancies, unspecified trimester: Secondary | ICD-10-CM

## 2022-05-10 DIAGNOSIS — O9902 Anemia complicating childbirth: Secondary | ICD-10-CM | POA: Diagnosis present

## 2022-05-10 DIAGNOSIS — O99019 Anemia complicating pregnancy, unspecified trimester: Secondary | ICD-10-CM | POA: Diagnosis present

## 2022-05-10 DIAGNOSIS — Z3490 Encounter for supervision of normal pregnancy, unspecified, unspecified trimester: Secondary | ICD-10-CM

## 2022-05-10 LAB — CBC
HCT: 35.7 % — ABNORMAL LOW (ref 36.0–46.0)
Hemoglobin: 11 g/dL — ABNORMAL LOW (ref 12.0–15.0)
MCH: 24.1 pg — ABNORMAL LOW (ref 26.0–34.0)
MCHC: 30.8 g/dL (ref 30.0–36.0)
MCV: 78.3 fL — ABNORMAL LOW (ref 80.0–100.0)
Platelets: 316 10*3/uL (ref 150–400)
RBC: 4.56 MIL/uL (ref 3.87–5.11)
RDW: 14.8 % (ref 11.5–15.5)
WBC: 8.1 10*3/uL (ref 4.0–10.5)
nRBC: 0 % (ref 0.0–0.2)

## 2022-05-10 LAB — RPR: RPR Ser Ql: NONREACTIVE

## 2022-05-10 LAB — TYPE AND SCREEN
ABO/RH(D): A POS
Antibody Screen: NEGATIVE

## 2022-05-10 MED ORDER — OXYTOCIN-SODIUM CHLORIDE 30-0.9 UT/500ML-% IV SOLN
2.5000 [IU]/h | INTRAVENOUS | Status: DC
  Administered 2022-05-10: 2.5 [IU]/h via INTRAVENOUS
  Filled 2022-05-10: qty 500

## 2022-05-10 MED ORDER — LIDOCAINE HCL (PF) 1 % IJ SOLN
INTRAMUSCULAR | Status: DC | PRN
  Administered 2022-05-10: 6 mL via EPIDURAL

## 2022-05-10 MED ORDER — DIPHENHYDRAMINE HCL 25 MG PO CAPS: 25.0000 mg | ORAL_CAPSULE | Freq: Four times a day (QID) | ORAL | Status: DC | PRN

## 2022-05-10 MED ORDER — ONDANSETRON HCL 4 MG PO TABS: 4.0000 mg | ORAL_TABLET | ORAL | Status: DC | PRN

## 2022-05-10 MED ORDER — SENNOSIDES-DOCUSATE SODIUM 8.6-50 MG PO TABS
2.0000 | ORAL_TABLET | ORAL | Status: DC
  Administered 2022-05-11 – 2022-05-12 (×2): 2 via ORAL
  Filled 2022-05-10 (×2): qty 2

## 2022-05-10 MED ORDER — SIMETHICONE 80 MG PO CHEW: 80.0000 mg | CHEWABLE_TABLET | ORAL | Status: DC | PRN

## 2022-05-10 MED ORDER — LIDOCAINE HCL (PF) 1 % IJ SOLN
30.0000 mL | INTRAMUSCULAR | Status: AC | PRN
  Administered 2022-05-10: 30 mL via SUBCUTANEOUS
  Filled 2022-05-10: qty 30

## 2022-05-10 MED ORDER — IBUPROFEN 600 MG PO TABS
600.0000 mg | ORAL_TABLET | Freq: Four times a day (QID) | ORAL | Status: DC
  Administered 2022-05-11 – 2022-05-12 (×7): 600 mg via ORAL
  Filled 2022-05-10 (×7): qty 1

## 2022-05-10 MED ORDER — EPHEDRINE 5 MG/ML INJ: 10.0000 mg | INTRAVENOUS | Status: DC | PRN

## 2022-05-10 MED ORDER — ACETAMINOPHEN 325 MG PO TABS: 650.0000 mg | ORAL_TABLET | ORAL | Status: DC | PRN

## 2022-05-10 MED ORDER — LEVONORGESTREL 20 MCG/DAY IU IUD
1.0000 | INTRAUTERINE_SYSTEM | Freq: Once | INTRAUTERINE | Status: DC
  Filled 2022-05-10: qty 1

## 2022-05-10 MED ORDER — MISOPROSTOL 50MCG HALF TABLET
50.0000 ug | ORAL_TABLET | Freq: Once | ORAL | Status: AC
  Administered 2022-05-10: 50 ug via BUCCAL
  Filled 2022-05-10: qty 1

## 2022-05-10 MED ORDER — LACTATED RINGERS IV SOLN: 500.0000 mL | INTRAVENOUS | Status: DC | PRN

## 2022-05-10 MED ORDER — OXYTOCIN-SODIUM CHLORIDE 30-0.9 UT/500ML-% IV SOLN
1.0000 m[IU]/min | INTRAVENOUS | Status: DC
  Administered 2022-05-10: 2 m[IU]/min via INTRAVENOUS

## 2022-05-10 MED ORDER — FENTANYL-BUPIVACAINE-NACL 0.5-0.125-0.9 MG/250ML-% EP SOLN
12.0000 mL/h | EPIDURAL | Status: DC | PRN
  Administered 2022-05-10: 12 mL/h via EPIDURAL
  Filled 2022-05-10: qty 250

## 2022-05-10 MED ORDER — TERBUTALINE SULFATE 1 MG/ML IJ SOLN
0.2500 mg | Freq: Once | INTRAMUSCULAR | Status: DC | PRN
Start: 2022-05-10 — End: 2022-05-10

## 2022-05-10 MED ORDER — OXYCODONE HCL 5 MG PO TABS: 10.0000 mg | ORAL_TABLET | ORAL | Status: DC | PRN

## 2022-05-10 MED ORDER — SOD CITRATE-CITRIC ACID 500-334 MG/5ML PO SOLN: 30.0000 mL | ORAL | Status: DC | PRN

## 2022-05-10 MED ORDER — SODIUM CHLORIDE 0.9 % IV SOLN
INTRAVENOUS | Status: AC
  Filled 2022-05-10: qty 5

## 2022-05-10 MED ORDER — TERBUTALINE SULFATE 1 MG/ML IJ SOLN: 0.2500 mg | Freq: Once | INTRAMUSCULAR | Status: DC | PRN

## 2022-05-10 MED ORDER — FENTANYL CITRATE (PF) 100 MCG/2ML IJ SOLN
50.0000 ug | INTRAMUSCULAR | Status: DC | PRN
  Administered 2022-05-10: 100 ug via INTRAVENOUS
  Filled 2022-05-10: qty 2

## 2022-05-10 MED ORDER — BENZOCAINE-MENTHOL 20-0.5 % EX AERO
1.0000 | INHALATION_SPRAY | CUTANEOUS | Status: DC | PRN
Start: 2022-05-10 — End: 2022-05-12
  Administered 2022-05-11: 1 via TOPICAL
  Filled 2022-05-10: qty 56

## 2022-05-10 MED ORDER — ONDANSETRON HCL 4 MG/2ML IJ SOLN: 4.0000 mg | INTRAMUSCULAR | Status: DC | PRN

## 2022-05-10 MED ORDER — PHENYLEPHRINE 80 MCG/ML (10ML) SYRINGE FOR IV PUSH (FOR BLOOD PRESSURE SUPPORT): 80.0000 ug | PREFILLED_SYRINGE | INTRAVENOUS | Status: DC | PRN

## 2022-05-10 MED ORDER — OXYCODONE HCL 5 MG PO TABS: 5.0000 mg | ORAL_TABLET | ORAL | Status: DC | PRN

## 2022-05-10 MED ORDER — COCONUT OIL OIL
1.0000 | TOPICAL_OIL | Status: DC | PRN
Start: 2022-05-10 — End: 2022-05-12

## 2022-05-10 MED ORDER — DIPHENHYDRAMINE HCL 50 MG/ML IJ SOLN: 12.5000 mg | INTRAMUSCULAR | Status: DC | PRN

## 2022-05-10 MED ORDER — WITCH HAZEL-GLYCERIN EX PADS: 1.0000 | MEDICATED_PAD | CUTANEOUS | Status: DC | PRN

## 2022-05-10 MED ORDER — LACTATED RINGERS IV SOLN
500.0000 mL | Freq: Once | INTRAVENOUS | Status: AC
  Administered 2022-05-10: 500 mL via INTRAVENOUS

## 2022-05-10 MED ORDER — LACTATED RINGERS IV SOLN: INTRAVENOUS | Status: DC

## 2022-05-10 MED ORDER — DIBUCAINE (PERIANAL) 1 % EX OINT
1.0000 | TOPICAL_OINTMENT | CUTANEOUS | Status: DC | PRN
Start: 2022-05-10 — End: 2022-05-12

## 2022-05-10 MED ORDER — MAGNESIUM HYDROXIDE 400 MG/5ML PO SUSP: 30.0000 mL | ORAL | Status: DC | PRN

## 2022-05-10 MED ORDER — PRENATAL MULTIVITAMIN CH
1.0000 | ORAL_TABLET | Freq: Every day | ORAL | Status: DC
  Administered 2022-05-11 – 2022-05-12 (×2): 1 via ORAL
  Filled 2022-05-10 (×2): qty 1

## 2022-05-10 MED ORDER — OXYTOCIN BOLUS FROM INFUSION
333.0000 mL | Freq: Once | INTRAVENOUS | Status: AC
  Administered 2022-05-10: 333 mL via INTRAVENOUS

## 2022-05-10 MED ORDER — ONDANSETRON HCL 4 MG/2ML IJ SOLN: 4.0000 mg | Freq: Four times a day (QID) | INTRAMUSCULAR | Status: DC | PRN

## 2022-05-10 NOTE — Procedures (Incomplete)
  Post-Placental IUD Insertion Procedure Note  Patient identified, informed consent signed prior to delivery, signed copy in chart, time out was performed.    - Mirena IUD grasped between sterile gloved fingers. Sterile lubrication applied to sterile gloved hand for ease of insertion. Fundus identified through abdominal wall using non-insertion hand. IUD inserted to fundus with bimanual technique. IUD carefully released at the fundus and insertion hand gently removed from vagina.    Strings trimmed to the level of the cervical os. Patient tolerated procedure well.  Lot # *** Expiration Date***/***/***

## 2022-05-10 NOTE — Anesthesia Procedure Notes (Signed)
Epidural Patient location during procedure: OB Start time: 05/10/2022 1:00 PM End time: 05/10/2022 1:10 PM  Staffing Anesthesiologist: Mellody Dance, MD Performed: anesthesiologist   Preanesthetic Checklist Completed: patient identified, IV checked, site marked, risks and benefits discussed, monitors and equipment checked, pre-op evaluation and timeout performed  Epidural Patient position: sitting Prep: DuraPrep Patient monitoring: heart rate, cardiac monitor, continuous pulse ox and blood pressure Approach: midline Location: L2-L3 Injection technique: LOR saline  Needle:  Needle type: Tuohy  Needle gauge: 17 G Needle length: 9 cm Needle insertion depth: 3 cm Catheter type: closed end flexible Catheter size: 20 Guage Catheter at skin depth: 7 cm Test dose: negative and Other  Assessment Events: blood not aspirated, injection not painful, no injection resistance and negative IV test  Additional Notes Informed consent obtained prior to proceeding including risk of failure, 1% risk of PDPH, risk of minor discomfort and bruising.  Discussed rare but serious complications including epidural abscess, permanent nerve injury, epidural hematoma.  Discussed alternatives to epidural analgesia and patient desires to proceed.  Timeout performed pre-procedure verifying patient name, procedure, and platelet count.  Patient tolerated procedure well.

## 2022-05-10 NOTE — Anesthesia Preprocedure Evaluation (Signed)
Anesthesia Evaluation  Patient identified by MRN, date of birth, ID band Patient awake    Reviewed: Allergy & Precautions, NPO status , Patient's Chart, lab work & pertinent test results  Airway Mallampati: I  TM Distance: >3 FB Neck ROM: Full    Dental no notable dental hx.    Pulmonary asthma ,    Pulmonary exam normal breath sounds clear to auscultation       Cardiovascular negative cardio ROS Normal cardiovascular exam Rhythm:Regular Rate:Normal     Neuro/Psych  Headaches, PSYCHIATRIC DISORDERS Anxiety    GI/Hepatic negative GI ROS, Neg liver ROS,   Endo/Other  negative endocrine ROS  Renal/GU negative Renal ROS  negative genitourinary   Musculoskeletal negative musculoskeletal ROS (+)   Abdominal   Peds negative pediatric ROS (+)  Hematology  (+) Blood dyscrasia, anemia ,   Anesthesia Other Findings   Reproductive/Obstetrics (+) Pregnancy                             Anesthesia Physical Anesthesia Plan  ASA: 2  Anesthesia Plan: Epidural   Post-op Pain Management:    Induction:   PONV Risk Score and Plan: 2 and Treatment may vary due to age or medical condition  Airway Management Planned: Natural Airway  Additional Equipment:   Intra-op Plan:   Post-operative Plan:   Informed Consent: I have reviewed the patients History and Physical, chart, labs and discussed the procedure including the risks, benefits and alternatives for the proposed anesthesia with the patient or authorized representative who has indicated his/her understanding and acceptance.       Plan Discussed with: Anesthesiologist  Anesthesia Plan Comments:         Anesthesia Quick Evaluation

## 2022-05-10 NOTE — H&P (Signed)
LABOR AND DELIVERY ADMISSION HISTORY AND PHYSICAL NOTE  Mariah Haas is a 26 y.o. female G1P0 with IUP at 54w1dby LMP c/w [place]ing for post dates IOL.   She reports positive fetal movement. She denies leakage of fluid, vaginal bleeding, or contractions.   She plans on breast feeding. Her contraception plan is: inpatient hormonal IUD.  [facility] [place]  [facility] , CWD, normal anatomy, cephalic presentation, posterior placenta, 18%ile, EFW [id number] Pregnancy complications:  - IUGR, resolved - Rubella non immune - asthma, allergies - alpha thal silent carrier  Past Medical History: Past Medical History:  Diagnosis Date   Allergy    Anxiety    Asthma     Past Surgical History: Past Surgical History:  Procedure Laterality Date   NO PAST SURGERIES      Obstetrical History: OB History     Gravida  1   Para      Term      Preterm      AB      Living         SAB      IAB      Ectopic      Multiple      Live Births              Social History: Social History   Socioeconomic History   Marital status: Single    Spouse name: Not on file   Number of children: Not on file   Years of education: Not on file   Highest education level: Not on file  Occupational History   Not on file  Tobacco Use   Smoking status: Never   Smokeless tobacco: Never  Vaping Use   Vaping Use: Never used  Substance and Sexual Activity   Alcohol use: No   Drug use: No   Sexual activity: Yes    Birth control/protection: None  Other Topics Concern   Not on file  Social History Narrative   Not on file   Social Determinants of Health   Financial Resource Strain: Not on file  Food Insecurity: No Food Insecurity (03/28/2022)   Hunger Vital Sign    Worried About Running Out of Food in the Last Year: Never true    Ran Out of Food in the Last Year: Never true  Transportation Needs: No Transportation Needs  (03/28/2022)   PRAPARE - THydrologist(Medical): No    Lack of Transportation (Non-Medical): No  Physical Activity: Not on file  Stress: Not on file  Social Connections: Not on file    Family History: Family History  Problem Relation Age of Onset   Seizures Mother    Bipolar disorder Mother    Anxiety disorder Mother    Migraines Mother    Headache Mother    Diabetes Mother    Lung cancer Maternal Grandmother    Migraines Maternal Grandfather    Bipolar disorder Maternal Grandfather    Seizures Maternal Aunt        Febrile Szs as an infant   Migraines Maternal Aunt    Bipolar disorder Maternal Aunt    Anxiety disorder Maternal Aunt    Cancer Maternal Aunt        lung   Cancer Paternal Uncle        Astrocytoma    Allergies: Allergies  Allergen Reactions   Blueberry [Vaccinium Angustifolium]    Theraflu Cold & [Phenylephrine-Pheniramine-Dm]     ?  sob/vomitin    Medications Prior to Admission  Medication Sig Dispense Refill Last Dose   ferrous sulfate 325 (65 FE) MG EC tablet Take 1 tablet (325 mg total) by mouth every other day. 30 tablet 2    Prenatal Vit-Fe Fumarate-FA (MULTIVITAMIN-PRENATAL) 27-0.8 MG TABS tablet Take 1 tablet by mouth daily at 12 noon.        Review of Systems  All systems reviewed and negative except as stated in HPI  Physical Exam Last menstrual period 08/02/2021. General appearance: alert, oriented, NAD Lungs: normal respiratory effort Heart: regular rate Abdomen: soft, non-tender; gravid Extremities: No calf swelling or tenderness Presentation: cephalic by SVE  Fetal monitoringBaseline: 135 bpm, Variability: Good {> 6 bpm), Accelerations: Reactive, and Decelerations: Absent Uterine activity: irregular, irritability     Prenatal labs: ABO, Rh: A/Positive/-- (12/13 1449) Antibody: Negative (12/13 1449) Rubella: <0.90 (12/13 1449) RPR: Non Reactive (03/30 0830)  HBsAg: Negative (12/13 1449)  HIV: Non  Reactive (03/30 0830)  GC/Chlamydia:  Neisseria Gonorrhea  Date Value Ref Range Status  04/11/2022 Negative  Final   Chlamydia  Date Value Ref Range Status  04/11/2022 Negative  Final   GBS: Negative/-- (05/24 1506)  2-hr GTT: normal Genetic screening:  low risk panorama. Silent alpha thal carrier Anatomy US: IUGR, subsequently resolved, otherwise normal  Prenatal Transfer Tool  Maternal Diabetes: No Genetic Screening: Normal Maternal Ultrasounds/Referrals: IUGR and Other:now resolved Fetal Ultrasounds or other Referrals:  Referred to Materal Fetal Medicine  Maternal Substance Abuse:  No Significant Maternal Medications:  None Significant Maternal Lab Results: Group B Strep negative  No results found for this or any previous visit (from the past 24 hour(s)).  Patient Active Problem List   Diagnosis Date Noted   Anemia of pregnancy 03/13/2022   IUGR (intrauterine growth restriction) affecting care of mother 01/22/2022   Alpha thalassemia silent carrier 11/14/2021   Rubella non-immune status, antepartum 11/01/2021   Supervision of other normal pregnancy, antepartum 10/03/2021   Mild intermittent asthma, uncomplicated 34/91/7915   Seasonal and perennial allergic rhinitis 02/06/2018   Cyst of ovary 12/16/2017   Allergic rhinitis due to allergen 03/18/2017   Migraine without aura and without status migrainosus, not intractable 01/28/2014    Assessment: Mariah Haas is a 26 y.o. G1P0 at 41w1dhere for post dates induction.   #Labor: FB placed w 60cc in balloon, give 50 mcg buccal cytotec  #Pain: IV pain meds PRN, epidural upon request #FWB: Cat I #GBS/ID: neg #MOF: Breast #MOC: post placental hormonal IUD #Circ: n/a #Rubella non immune: offer MMR PP  MClarnce Flock6/22/2023, 9:01 AM

## 2022-05-10 NOTE — Discharge Summary (Shared)
Postpartum Discharge Summary  Date of Service updated***     Patient Name: Mariah Haas DOB: [birth year] MRN: [id number]  Date of admission: 05/10/2022 Delivery date:05/10/2022  Delivering provider: Clarnce Flock  Date of discharge: 05/10/2022  Admitting diagnosis: Post-dates pregnancy [O48.0] Intrauterine pregnancy: [redacted]w[redacted]d    Secondary diagnosis:  Principal Problem:   Post-dates pregnancy Active Problems:   Supervision of other normal pregnancy, antepartum   Rubella non-immune status, antepartum   Alpha thalassemia silent carrier   IUGR (intrauterine growth restriction) affecting care of mother   Iron deficiency anemia during pregnancy   IUD (intrauterine device) in place  Additional problems: ***    Discharge diagnosis: Term Pregnancy Delivered                                              Post partum procedures: Mirena IUD insertion Augmentation: AROM, Pitocin, Cytotec, and IP Foley Complications: None  Hospital course: Induction of Labor With Vaginal Delivery   26y.o. yo G1P0 at 24w1das admitted to the hospital 05/10/2022 for induction of labor.  Indication for induction: Postdates.  Patient had an uncomplicated labor course as follows: Membrane Rupture Time/Date: 12:50 PM ,05/10/2022   Delivery Method:Vaginal, Vacuum (Extractor)  Episiotomy: None  Lacerations:  1st degree;Perineal;Labial  Details of delivery can be found in separate delivery note.  Patient had a routine postpartum course. Patient is discharged home 05/10/22.  Newborn Data: Birth date:05/10/2022  Birth time:10:03 PM  Gender:Female  Living status:Living  Apgars:8 ,9  Weight:   Magnesium Sulfate received: No BMZ received: No Rhophylac:N/A MMR:{MMR:30440033} T-DaP: declineed Flu: Yes Transfusion:{Transfusion received:30440034}  Physical exam  Vitals:   05/10/22 2215 05/10/22 2235 05/10/22 2245 05/10/22 2300  BP: 140/89 (!) 120/96 123/77 129/76  Pulse: 92 (!) 127 83 91  Resp: 16    16  Temp: 98.3 F (36.8 C)     TempSrc: Oral     SpO2:      Weight:      Height:       General: {Exam; general:21111117} Lochia: {Desc; appropriate/inappropriate:30686::"appropriate"} Uterine Fundus: {Desc; firm/soft:30687} Incision: {Exam; incision:21111123} DVT Evaluation: {Exam; dvt:2111122} Labs: Lab Results  Component Value Date   WBC 8.1 05/10/2022   HGB 11.0 (L) 05/10/2022   HCT 35.7 (L) 05/10/2022   MCV 78.3 (L) 05/10/2022   PLT 316 05/10/2022      Latest Ref Rng & Units 01/01/2022    4:00 PM  CMP  Glucose 70 - 99 mg/dL 82   BUN 6 - 20 mg/dL 10   Creatinine 0.44 - 1.00 mg/dL 0.61   Sodium 135 - 145 mmol/L 137   Potassium 3.5 - 5.1 mmol/L 4.1   Chloride 98 - 111 mmol/L 107   CO2 22 - 32 mmol/L 20   Calcium 8.9 - 10.3 mg/dL 9.0   Total Protein 6.5 - 8.1 g/dL 7.0   Total Bilirubin 0.3 - 1.2 mg/dL 0.6   Alkaline Phos 38 - 126 U/L 42   AST 15 - 41 U/L 25   ALT 0 - 44 U/L 26    Edinburgh Score:     No data to display           After visit meds:  Allergies as of 05/10/2022       Reactions   Blueberry [vaccinium Angustifolium]    Theraflu Cold & [phenylephrine-pheniramine-dm]    ?  sob/vomitin     Med Rec must be completed prior to using this [facility]***        Discharge home in stable condition Infant Feeding: Breast Infant Disposition:home with mother Discharge instruction: per After Visit Summary and Postpartum booklet. Activity: Advance as tolerated. Pelvic rest for 6 weeks.  Diet: routine diet Future Appointments: Future Appointments  Date Time Provider [place]  06/15/2022 10:35 AM Clarnce Flock, MD [facility]   Follow up Visit:   Please schedule this patient for a In person postpartum visit in 4 weeks with the following provider:  Lerae Langham . Additional Postpartum F/U: IUD string check   Low risk pregnancy complicated by:  n/a Delivery mode:  Vaginal, Vacuum (Extractor)  Anticipated Birth Control:  PP IUD  placed   05/10/2022 Clarnce Flock, MD

## 2022-05-10 NOTE — Lactation Note (Signed)
This note was copied from a baby's chart. Lactation Consultation Note  Patient Name: Mariah Haas [id number] Date: 05/10/2022   Age:24 hours  LC went to assist with breastfeeding. RN weighing infant at the time and taking measurements. [facility] staff to follow up in am.   Maternal Data    Feeding    LATCH Score                    Lactation Tools Discussed/Used    Interventions    Discharge    Consult Status      [name]  [name] 05/10/2022, 11:09 PM

## 2022-05-11 ENCOUNTER — Other Ambulatory Visit: Payer: Self-pay

## 2022-05-11 LAB — CBC
HCT: 27.5 % — ABNORMAL LOW (ref 36.0–46.0)
Hemoglobin: 8.8 g/dL — ABNORMAL LOW (ref 12.0–15.0)
MCH: 24.6 pg — ABNORMAL LOW (ref 26.0–34.0)
MCHC: 32 g/dL (ref 30.0–36.0)
MCV: 76.8 fL — ABNORMAL LOW (ref 80.0–100.0)
Platelets: 237 10*3/uL (ref 150–400)
RBC: 3.58 MIL/uL — ABNORMAL LOW (ref 3.87–5.11)
RDW: 14.6 % (ref 11.5–15.5)
WBC: 18.8 10*3/uL — ABNORMAL HIGH (ref 4.0–10.5)
nRBC: 0 % (ref 0.0–0.2)

## 2022-05-11 MED ORDER — MEASLES, MUMPS & RUBELLA VAC IJ SOLR
0.5000 mL | Freq: Once | INTRAMUSCULAR | Status: AC
  Administered 2022-05-12: 0.5 mL via SUBCUTANEOUS
  Filled 2022-05-11: qty 0.5

## 2022-05-11 NOTE — Social Work (Signed)
CSW acknowledged consult. CSW attempted to see MOB however she asked to be seen later, since visitors are present.   CSW will attempt to see MOB later as requested.   Vivi Barrack, MSW, LCSW [facility]  Clinical Social Worker  [phone] 05/11/2022  3:49 PM

## 2022-05-12 MED ORDER — IBUPROFEN 600 MG PO TABS: 600.0000 mg | ORAL_TABLET | Freq: Four times a day (QID) | ORAL | 0 refills | Status: DC

## 2022-05-12 MED ORDER — SODIUM CHLORIDE 0.9 % IV SOLN
500.0000 mg | Freq: Once | INTRAVENOUS | Status: AC
  Administered 2022-05-12: 500 mg via INTRAVENOUS
  Filled 2022-05-12: qty 25

## 2022-05-18 ENCOUNTER — Telehealth (HOSPITAL_COMMUNITY): Payer: Self-pay | Admitting: *Deleted

## 2022-05-18 NOTE — Telephone Encounter (Signed)
Mom reports feeling good. No concerns about herself at this time. Mom reports feeling well emotionally. [facility] epds score=0) Mom reports baby is doing well. Feeding, peeing, and pooping without difficulty. Safe sleep reviewed. Mom reports no concerns about baby at present.  Duffy Rhody, RN 05-18-2022 at 9:21am

## 2022-06-15 ENCOUNTER — Encounter: Payer: Self-pay | Admitting: Family Medicine

## 2022-06-15 ENCOUNTER — Other Ambulatory Visit: Payer: Self-pay

## 2022-06-15 ENCOUNTER — Ambulatory Visit (INDEPENDENT_AMBULATORY_CARE_PROVIDER_SITE_OTHER): Payer: No Typology Code available for payment source | Admitting: Family Medicine

## 2022-06-15 DIAGNOSIS — Z2839 Other underimmunization status: Secondary | ICD-10-CM

## 2022-06-15 DIAGNOSIS — O09899 Supervision of other high risk pregnancies, unspecified trimester: Secondary | ICD-10-CM

## 2022-06-15 DIAGNOSIS — Z975 Presence of (intrauterine) contraceptive device: Secondary | ICD-10-CM

## 2022-06-15 DIAGNOSIS — O99013 Anemia complicating pregnancy, third trimester: Secondary | ICD-10-CM

## 2022-06-15 DIAGNOSIS — D509 Iron deficiency anemia, unspecified: Secondary | ICD-10-CM

## 2022-06-15 MED ORDER — FERROUS SULFATE 325 (65 FE) MG PO TBEC: 325.0000 mg | DELAYED_RELEASE_TABLET | ORAL | 2 refills | Status: DC

## 2022-06-15 NOTE — Progress Notes (Addendum)
[place] Partum Visit Note  Mariah Haas is a 26 y.o. G95P1001 female who presents for a postpartum visit. She is 5 weeks postpartum following a normal spontaneous vaginal delivery.  I have fully reviewed the prenatal and intrapartum course. The delivery was at 88w1dgestational weeks.  Anesthesia: epidural. Postpartum course has been uncomplicated. Baby is doing well. Baby is feeding by  bottle fed pumped breast milk . Bleeding staining only. Bowel function is normal. Bladder function is normal. Patient is not sexually active. Contraception method is IUD. Postpartum depression screening: negative.  The pregnancy intention screening data noted above was reviewed. Potential methods of contraception were discussed. The patient elected to proceed with No data recorded.  There are no preventive care reminders to display for this patient.  The following portions of the patient's history were reviewed and updated as appropriate: allergies, current medications, past family history, past medical history, past social history, past surgical history, and problem list.  Review of Systems Pertinent items are noted in HPI.  Objective:  BP 104/61   LMP 08/02/2021   Breastfeeding No    General:  alert, cooperative, and appears stated age   Breasts:  normal, not examined. Mom has no concerns  Lungs: clear to auscultation bilaterally  Heart:  regular rate and rhythm, S1, S2 normal, no murmur, click, rub or gallop  Abdomen: Soft, non tender, uterus involuted    Wound None. Vaginal delivery  GU exam:  Normal external genitalia; cervix normal, string visualized and trimmed       Assessment:   1. Encounter for postpartum visit  2. Rubella non-immune status, antepartum  MMR given in hospital.  3. IUD (intrauterine device) in place Strings visualized - 1 trimmed.  4. Iron deficiency anemia, unspecified iron deficiency anemia type  5. Anemia during pregnancy in third trimester Recommend  continuing Fe supplementation for a couple months - ferrous sulfate 325 (65 FE) MG EC tablet; Take 1 tablet (325 mg total) by mouth every other day.  Dispense: 30 tablet; Refill: 2  normal postpartum exam.   Plan:   Essential components of care per ACOG recommendations:  1.  Mood and well being: Patient with negative depression screening today. Reviewed local resources for support.  - Patient tobacco use? No.   - hx of drug use? No.    2. Infant care and feeding:  -Patient currently breastmilk feeding? Yes. Discussed returning to work and pumping. Reviewed importance of draining breast regularly to support lactation.  -Social determinants of health (SDOH) reviewed in EPIC. No concerns. Both sets of grandparents are in the area and helping with child care.  3. Sexuality, contraception and birth spacing - Patient does not want a pregnancy in the next year.  Desired family size is 2 children.  - Reviewed reproductive life planning. Reviewed contraceptive methods based on pt preferences and effectiveness.  Patient desired IUD or IUS which has already been placed post partum - Discussed birth spacing of 18 months  4. Sleep and fatigue -Encouraged family/partner/community support of 4 hrs of uninterrupted sleep to help with mood and fatigue  5. Physical Recovery  - Discussed patients delivery and complications. She describes her labor as good. - Patient had a vacuum assisted vaginal delivery. Patient had a 1st degree laceration. Perineal healing reviewed. Patient expressed understanding - Patient has urinary incontinence? No. - Patient is safe to resume physical and sexual activity  6.  Health Maintenance - HM due items addressed No - UTD on h/maintenance -  Last pap smear  Diagnosis  Date Value Ref Range Status  10/31/2021   Final   - Negative for intraepithelial lesion or malignancy (NILM)   Pap smear not done at today's visit.  -Breast Cancer screening indicated? No.   7. Chronic  Disease/Pregnancy Condition follow up: Anemia - as above  - PCP follow up  Liliane Channel MD MPH OB Fellow, [facility] for [employer], [place]

## 2022-06-22 MED FILL — Levonorgestrel IUD 20 MCG/DAY (Initial) (52 MG Total): INTRAUTERINE | Qty: 1 | Status: AC

## 2022-07-03 ENCOUNTER — Encounter: Payer: Self-pay | Admitting: Family Medicine

## 2023-01-11 ENCOUNTER — Emergency Department (HOSPITAL_COMMUNITY)
Admission: EM | Admit: 2023-01-11 | Discharge: 2023-01-12 | Discharge status: Against Medical Advice | Payer: Medicaid Other | Attending: Emergency Medicine | Admitting: Emergency Medicine

## 2023-01-11 ENCOUNTER — Encounter (HOSPITAL_COMMUNITY): Payer: Self-pay

## 2023-01-11 DIAGNOSIS — Z5329 Procedure and treatment not carried out because of patient's decision for other reasons: Secondary | ICD-10-CM | POA: Diagnosis not present

## 2023-01-11 DIAGNOSIS — N926 Irregular menstruation, unspecified: Secondary | ICD-10-CM

## 2023-01-11 DIAGNOSIS — N946 Dysmenorrhea, unspecified: Secondary | ICD-10-CM | POA: Diagnosis present

## 2023-01-11 NOTE — ED Provider Triage Note (Signed)
Emergency Medicine Provider Triage Evaluation Note  Mariah Haas , a 27 y.o. female  was evaluated in triage.  Pt complains of lower pelvic pain, concern for IUD coming out, reports cramping pain dissimilar for normal menstrual cramping.  Review of Systems  Positive: Vaginal bleeding, pelvic cramping, IUD issue Negative:   Physical Exam  BP 121/88   Pulse 77   Temp 98.1 F (36.7 C)   Resp 18   LMP 01/09/2023   SpO2 100%  Gen:   Awake, no distress   Resp:  Normal effort  MSK:   Moves extremities without difficulty  Other:  Mild ttp lower abdominal pain  Medical Decision Making  Medically screening exam initiated at 8:43 PM.  Appropriate orders placed.  Mariah Haas was informed that the remainder of the evaluation will be completed by another provider, this initial triage assessment does not replace that evaluation, and the importance of remaining in the ED until their evaluation is complete.  Workup initiated in triage    Anselmo Pickler, [place] 01/11/23 2045

## 2023-01-11 NOTE — ED Triage Notes (Signed)
Pt has IUD placed in June but today had 8/10 pain in pelvis area today and noticed IUD has come out partially - can see it.  OBGYN advised to come here to be evaluated. Pt is  also on menstrual cycle.

## 2023-01-12 LAB — WET PREP, GENITAL
Clue Cells Wet Prep HPF POC: NONE SEEN
Sperm: NONE SEEN
Trich, Wet Prep: NONE SEEN
WBC, Wet Prep HPF POC: >=10 — AB (ref <10)
Yeast Wet Prep HPF POC: NONE SEEN

## 2023-01-12 LAB — I-STAT BETA HCG BLOOD, ED (MC, WL, AP ONLY): I-stat hCG, quantitative: <5 m[IU]/mL (ref <5)

## 2023-01-12 MED ORDER — KETOROLAC TROMETHAMINE 60 MG/2ML IM SOLN
30.0000 mg | Freq: Once | INTRAMUSCULAR | Status: AC
  Administered 2023-01-12: 30 mg via INTRAMUSCULAR
  Filled 2023-01-12: qty 2

## 2023-01-12 NOTE — ED Provider Notes (Signed)
[facility] Provider Note   CSN: [id number] Arrival date & time: 01/11/23  2026     History  Chief Complaint  Patient presents with   Abdominal Pain    Mariah Haas is a 27 y.o. female.  The history is provided by the patient.  Illness Location:  Vagina Quality:  Pain with menstrual cycle and strings from IUD feel long Severity:  Moderate Onset quality:  Sudden Duration:  7 hours Timing:  Constant Progression:  Unchanged Chronicity:  New Context:  Has IUD and strings feel long      Home Medications Prior to Admission medications   Medication Sig Start Date End Date Taking? Authorizing Provider  ferrous sulfate 325 (65 FE) MG EC tablet Take 1 tablet (325 mg total) by mouth every other day. 06/15/22 12/12/22  Clarnce Flock, MD  Prenatal Vit-Fe Fumarate-FA (MULTIVITAMIN-PRENATAL) 27-0.8 MG TABS tablet Take 1 tablet by mouth daily at 12 noon.    [provider]      Allergies    Blueberry [vaccinium angustifolium] and Theraflu cold & [phenylephrine-pheniramine-dm]    Review of Systems   Review of Systems  Physical Exam Updated Vital Signs BP 115/66   Pulse (!) 56   Temp 98.3 F (36.8 C)   Resp 18   LMP 01/09/2023   SpO2 100%  Physical Exam Vitals and nursing note reviewed. Exam conducted with a chaperone present.  Constitutional:      General: She is not in acute distress.    Appearance: She is well-developed.  HENT:     Head: Normocephalic and atraumatic.  Eyes:     Pupils: Pupils are equal, round, and reactive to light.  Cardiovascular:     Rate and Rhythm: Normal rate and regular rhythm.     Pulses: Normal pulses.     Heart sounds: Normal heart sounds.  Pulmonary:     Effort: Pulmonary effort is normal. No respiratory distress.     Breath sounds: Normal breath sounds.  Abdominal:     General: Bowel sounds are normal. There is no distension.     Palpations: Abdomen is soft.      Tenderness: There is no abdominal tenderness. There is no guarding or rebound.  Genitourinary:    Vagina: No vaginal discharge.     Comments: Strings present and menstrual blood no IUD in vagina no palpable or visible at [facility] Musculoskeletal:        General: Normal range of motion.     Cervical back: Neck supple.  Skin:    General: Skin is dry.     Capillary Refill: Capillary refill takes less than 2 seconds.     Findings: No erythema or rash.  Neurological:     General: No focal deficit present.     Deep Tendon Reflexes: Reflexes normal.  Psychiatric:        Mood and Affect: Mood normal.     ED Results / Procedures / Treatments   Labs (all labs ordered are listed, but only abnormal results are displayed) Results for orders placed or performed during the hospital encounter of 01/11/23  Wet prep, genital   Specimen: PATH Cytology Cervicovaginal Ancillary Only  Result Value Ref Range   Yeast Wet Prep HPF POC NONE SEEN NONE SEEN   Trich, Wet Prep NONE SEEN NONE SEEN   Clue Cells Wet Prep HPF POC NONE SEEN NONE SEEN   WBC, Wet Prep HPF POC >=10 (A) <10   Sperm  NONE SEEN   I-Stat Beta hCG blood, ED ([facility], [facility], [facility] only)  Result Value Ref Range   I-stat hCG, quantitative <5.0 <5 mIU/mL   Comment 3           No results found.  Radiology No results found.  Procedures Procedures    Medications Ordered in ED Medications - No data to display  ED Course/ Medical Decision Making/ A&P                             Medical Decision Making Feels like IUD is out  Problems Addressed: Menstrual problem:    Details: Pain with menstruation.  Follow up with GYN   Amount and/or Complexity of Data Reviewed Labs: ordered.    Details: Wet prep with white cells, pregnancy negative   Risk Prescription drug management. Risk Details: No IUD out of place on exam both strings visible.      Final Clinical Impression(s) / ED Diagnoses Final diagnoses:  None  Eloped   Rx / DC  Orders ED Discharge Orders     None         Saran Laviolette, MD 01/12/23 [id number]

## 2023-01-14 ENCOUNTER — Ambulatory Visit: Payer: No Typology Code available for payment source | Admitting: Obstetrics & Gynecology

## 2023-01-14 LAB — GC/CHLAMYDIA PROBE AMP (~~LOC~~) NOT AT ARMC
Chlamydia: NEGATIVE
Comment: NEGATIVE
Comment: NORMAL
Neisseria Gonorrhea: NEGATIVE

## 2023-01-15 ENCOUNTER — Ambulatory Visit: Payer: No Typology Code available for payment source | Admitting: Advanced Practice Midwife

## 2023-01-15 ENCOUNTER — Encounter: Payer: Self-pay | Admitting: Family Medicine

## 2023-01-15 ENCOUNTER — Other Ambulatory Visit: Payer: Self-pay

## 2023-01-15 ENCOUNTER — Ambulatory Visit (INDEPENDENT_AMBULATORY_CARE_PROVIDER_SITE_OTHER): Payer: Medicaid Other | Admitting: Family Medicine

## 2023-01-15 VITALS — BP 104/67 | HR 64 | Ht 60.0 in | Wt 85.7 lb

## 2023-01-15 DIAGNOSIS — Z975 Presence of (intrauterine) contraceptive device: Secondary | ICD-10-CM | POA: Diagnosis not present

## 2023-01-15 NOTE — Progress Notes (Signed)
[facility] GYNECOLOGY OFFICE VISIT NOTE  History:   Mariah Haas is a 27 y.o. G1P1001 here today for IUD concern.  Patient seen in [facility] on 01/11/2023 due to concern for IUD strings protruding Per review of note no IUD was visible or palpable at the os Strings were not trimmed at that time  Today reports strings have been hanging out No other concerns  Health Maintenance Due  Topic Date Due   INFLUENZA VACCINE  06/19/2022    Past Medical History:  Diagnosis Date   Allergy    Anxiety    Asthma     Past Surgical History:  Procedure Laterality Date   NO PAST SURGERIES      The following portions of the patient's history were reviewed and updated as appropriate: allergies, current medications, past family history, past medical history, past social history, past surgical history and problem list.   Health Maintenance:   Last pap: Lab Results  Component Value Date   DIAGPAP  10/31/2021    - Negative for intraepithelial lesion or malignancy (NILM)   [place] Negative 10/31/2021     Last mammogram:  N/a    Review of Systems:  Pertinent items noted in HPI and remainder of comprehensive ROS otherwise negative.  Physical Exam:  BP 104/67   Pulse 64   Ht 5' (1.524 m)   Wt 85 lb 11.2 oz (38.9 kg)   LMP 01/09/2023   BMI 16.74 kg/m  CONSTITUTIONAL: Well-developed, well-nourished female in no acute distress.  HEENT:  Normocephalic, atraumatic. External right and left ear normal. No scleral icterus.  NECK: Normal range of motion, supple, no masses noted on observation SKIN: No rash noted. Not diaphoretic. No erythema. No pallor. MUSCULOSKELETAL: Normal range of motion. No edema noted. NEUROLOGIC: Alert and oriented to person, place, and time. Normal muscle tone coordination.  PSYCHIATRIC: Normal mood and affect. Normal behavior. Normal judgment and thought content. RESPIRATORY: Effort normal, no problems with respiration noted ABDOMEN: No masses  noted. No other overt distention noted.   PELVIC:  Single IUD string visible protruding from vagina. IUD not palpated on bimanual exam, uterus normal size. Two strings seen, trimmed both to approximately 3 cm.  Labs and Imaging Results for orders placed or performed during the hospital encounter of 01/11/23 (from the past 168 hour(s))  GC/Chlamydia probe amp ([place]) not at [facility]   Collection Time: 01/12/23  2:02 AM  Result Value Ref Range   Neisseria Gonorrhea Negative    Chlamydia Negative    Comment Normal Reference [place] Chlamydia - Negative    Comment      Normal Reference Range Neisseria Gonorrhea - Negative  Wet prep, genital   Collection Time: 01/12/23  2:39 AM   Specimen: PATH Cytology Cervicovaginal Ancillary Only  Result Value Ref Range   Yeast Wet Prep HPF POC NONE SEEN NONE SEEN   Trich, Wet Prep NONE SEEN NONE SEEN   Clue Cells Wet Prep HPF POC NONE SEEN NONE SEEN   WBC, Wet Prep HPF POC >=10 (A) <10   Sperm NONE SEEN   I-Stat Beta hCG blood, ED ([facility], [facility], [facility] only)   Collection Time: 01/12/23  3:02 AM  Result Value Ref Range   I-stat hCG, quantitative <5.0 <5 mIU/mL   Comment 3           No results found.    Assessment and Plan:   Problem List Items Addressed This Visit       Other  IUD (intrauterine device) in place - Primary    Strings trimmed without issue. IUD not palpable at cervical os. Low suspicion for displacement, given placed post placentally more likely strings came out with most recent menstrual cycle. Discussed [place] for exact localization, after engaging in shared decision making we decided to forego this.        Routine preventative health maintenance measures emphasized. Please refer to After Visit Summary for other counseling recommendations.   Return if symptoms worsen or fail to improve.    Total face-to-face time with patient: 15 minutes.  Over 50% of encounter was spent on counseling and coordination of care.   Clarnce Flock,  MD/MPH Attending Family Medicine Physician, [facility] for [facility], [place]

## 2023-01-15 NOTE — Assessment & Plan Note (Signed)
Strings trimmed without issue. IUD not palpable at cervical os. Low suspicion for displacement, given placed post placentally more likely strings came out with most recent menstrual cycle. Discussed [place] for exact localization, after engaging in shared decision making we decided to forego this.

## 2023-01-15 NOTE — Patient Instructions (Signed)

## 2024-02-18 ENCOUNTER — Telehealth: Payer: Self-pay

## 2024-02-18 NOTE — Telephone Encounter (Signed)
 Copied from CRM [id number]. Topic: General - Other >> Feb 18, 2024  3:58 PM Rodman Pickle T wrote: Reason for CRM: patient would like a call back to discuss some vaccines she needs

## 2024-02-18 NOTE — Telephone Encounter (Signed)
 I called and spoke with patient. She was needing to know the dates of her last Tdap and Varicella. She is also going to need a TB test. She will keep appt for Friday & bring the form with her.

## 2024-02-21 ENCOUNTER — Encounter: Payer: Self-pay | Admitting: Family Medicine

## 2024-02-21 ENCOUNTER — Ambulatory Visit (INDEPENDENT_AMBULATORY_CARE_PROVIDER_SITE_OTHER): Payer: Self-pay | Admitting: Family Medicine

## 2024-02-21 ENCOUNTER — Encounter: Admitting: Family Medicine

## 2024-02-21 VITALS — BP 110/64 | HR 70 | Temp 98.4°F | Resp 12 | Ht 60.0 in | Wt 88.2 lb

## 2024-02-21 DIAGNOSIS — Z0000 Encounter for general adult medical examination without abnormal findings: Secondary | ICD-10-CM

## 2024-02-21 DIAGNOSIS — Z789 Other specified health status: Secondary | ICD-10-CM

## 2024-02-21 DIAGNOSIS — R636 Underweight: Secondary | ICD-10-CM

## 2024-02-21 DIAGNOSIS — D509 Iron deficiency anemia, unspecified: Secondary | ICD-10-CM

## 2024-02-21 DIAGNOSIS — Z111 Encounter for screening for respiratory tuberculosis: Secondary | ICD-10-CM

## 2024-02-21 LAB — CBC
HCT: 40 % (ref 36.0–46.0)
Hemoglobin: 13.4 g/dL (ref 12.0–15.0)
MCHC: 33.4 g/dL (ref 30.0–36.0)
MCV: 90.1 fl (ref 78.0–100.0)
Platelets: 288 K/uL (ref 150.0–400.0)
RBC: 4.44 Mil/uL (ref 3.87–5.11)
RDW: 12.9 % (ref 11.5–15.5)
WBC: 4.4 K/uL (ref 4.0–10.5)

## 2024-02-21 LAB — BASIC METABOLIC PANEL WITH GFR
BUN: 10 mg/dL (ref 6–23)
CO2: 25 meq/L (ref 19–32)
Calcium: 9.1 mg/dL (ref 8.4–10.5)
Chloride: 103 meq/L (ref 96–112)
Creatinine, Ser: 0.72 mg/dL (ref 0.40–1.20)
GFR: 116.07 mL/min (ref >60.00)
Glucose, Bld: 74 mg/dL (ref 70–99)
Potassium: 4.1 meq/L (ref 3.5–5.1)
Sodium: 136 meq/L (ref 135–145)

## 2024-02-21 LAB — TSH: TSH: 0.99 u[IU]/mL (ref 0.35–5.50)

## 2024-02-21 LAB — IRON: Iron: 77 ug/dL (ref 42–145)

## 2024-02-21 LAB — FERRITIN: Ferritin: 11.7 ng/mL (ref 10.0–291.0)

## 2024-02-21 NOTE — Progress Notes (Signed)
 HPI: Mariah Haas is a 28 y.o. female with a PMHx significant for migraine without aura and allergic rhinitis here today for her routine physical.  Last CPE: Over a year ago. Since her last visit on 05/26/21, she had been following with gyn/obs for pregnancy, delivered in 04/2022. No new problems since her last visit except for iron def anemia during pregnancy.  Exercise: Patient states she runs or goes to the gym every day.  Diet: She says she is eating fast food but is trying to improve. She eats vegetables daily and eats enough protein.   Sleep: ~6 hours per night.  Alcohol Use: none Smoking: never Vision: Not established with an eye doctor yet.  Dental: UTD on routine dental care.  LMP: 5 weeks ago. She has irregular menses.  She follows with gynecology and has an IUD.   Immunization History  Administered Date(s) Administered   DTaP 09/08/1998, 12/16/1998, 02/24/1999, 05/13/2000, 04/19/2003   HIB (PRP-OMP) 09/08/1998, 12/16/1998, 05/13/2000   Hepatitis A 10/04/2008, 10/11/2009   Hepatitis B 02/24/1999, 06/08/1999, 05/13/2000   Hpv-Unspecified 10/11/2009, 11/16/2009, 04/02/2011   IPV 09/08/1998, 12/16/1998, 08/07/1999, 04/19/2003   Influenza,inj,Quad PF,6+ Mos 09/11/2018   Influenza-Unspecified 10/03/2021   MMR 08/07/1999, 04/19/2003, 05/12/2022   MenQuadfi_Meningococcal Groups ACYW Conjugate 10/11/2009, 08/16/2014   PFIZER(Purple Top)SARS-COV-2 Vaccination 04/23/2020, 05/16/2020   Pneumococcal Conjugate-13 05/13/2000, 08/02/2000   Tdap 10/11/2009, 04/14/2021, 02/26/2022   Varicella 08/07/1999   Health Maintenance  Topic Date Due   Pneumococcal Vaccine 63-81 Years old (1 of 1 - PPSV23 or PCV20) 07/22/2004   INFLUENZA VACCINE  06/19/2024   Cervical Cancer Screening (Pap smear)  10/31/2024   DTaP/Tdap/Td (9 - Td or Tdap) 02/27/2032   HPV VACCINES  Completed   Hepatitis C Screening  Completed   HIV Screening  Completed   COVID-19 Vaccine  Discontinued   Chronic  medical problems:   Not currently taking iron supplementation. She mentions she had an iron infusion shortly after her daughter was born.  Lab Results  Component Value Date   WBC 18.8 (H) 05/11/2022   HGB 8.8 (L) 05/11/2022   HCT 27.5 (L) 05/11/2022   MCV 76.8 (L) 05/11/2022   PLT 237 05/11/2022   She has had BMI < 18, she has lost some wt since her last visit.  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   Review of Systems  Constitutional:  Negative for activity change, appetite change and fever.  HENT:  Negative for mouth sores, sore throat and trouble swallowing.   Eyes:  Negative for redness and visual disturbance.  Respiratory:  Negative for cough, shortness of breath and wheezing.   Cardiovascular:  Negative for chest pain and leg swelling.  Gastrointestinal:  Negative for abdominal pain, nausea and vomiting.       No changes in bowel habits.  Endocrine: Negative for cold intolerance, heat intolerance, polydipsia, polyphagia and polyuria.  Genitourinary:  Negative for decreased urine volume, dysuria and hematuria.  Musculoskeletal:  Negative for gait problem and myalgias.  Skin:  Negative for color change and rash.  Allergic/Immunologic: Positive for environmental allergies.  Neurological:  Negative for syncope, weakness and headaches.  Hematological:  Negative for adenopathy. Does not bruise/bleed easily.  Psychiatric/Behavioral:  Negative for confusion. The patient is not nervous/anxious.   All other systems reviewed and are negative.  No current outpatient medications on file  prior to visit.   No current facility-administered medications on file prior to visit.   Past Medical History:  Diagnosis Date   Allergy    Anxiety    Asthma    Past Surgical History:  Procedure Laterality Date   NO PAST SURGERIES     Allergies  Allergen Reactions   Blueberry [Vaccinium Angustifolium]    Theraflu Cold & [Phenylephrine-Pheniramine-Dm]     ?sob/vomitin   Family History  Problem Relation Age of Onset   Seizures Mother    Bipolar disorder Mother    Anxiety disorder Mother    Migraines Mother    Headache Mother    Diabetes Mother    Lung cancer Maternal Grandmother    Migraines Maternal Grandfather    Bipolar disorder Maternal Grandfather    Seizures Maternal Aunt        Febrile Szs as an infant   Migraines Maternal Aunt    Bipolar disorder Maternal Aunt    Anxiety disorder Maternal Aunt    Cancer Maternal Aunt        lung   Cancer Paternal Uncle        Astrocytoma   Social History   Socioeconomic History   Marital status: Single    Spouse name: Not on file   Number of children: Not on file   Years of education: Not on file   Highest education level: Associate degree: academic program  Occupational History   Not on file  Tobacco Use   Smoking status: Never   Smokeless tobacco: Never  Vaping Use   Vaping status: Never Used  Substance and Sexual Activity   Alcohol use: No   Drug use: No   Sexual activity: Yes    Birth control/protection: None  Other Topics Concern   Not on file  Social History Narrative   Not on file   Social Drivers of Health   Financial Resource Strain: Low Risk  (02/20/2024)   Overall Financial Resource Strain (CARDIA)    Difficulty of Paying Living Expenses: Not very hard  Food Insecurity: No Food Insecurity (02/20/2024)   Hunger Vital Sign    Worried About Running Out of Food in the Last Year: Never true    Ran Out of Food in the Last Year: Never true  Transportation Needs: No Transportation Needs (02/20/2024)    PRAPARE - Administrator, Civil Service (Medical): No    Lack of Transportation (Non-Medical): No  Physical Activity: Sufficiently Active (02/20/2024)   Exercise Vital Sign    Days of Exercise per Week: 7 days    Minutes of Exercise per Session: 120 min  Stress: No Stress Concern Present (02/20/2024)   [employer] of Occupational Health - Occupational Stress Questionnaire    Feeling of Stress : Not at all  Social Connections: Unknown (02/20/2024)   Social Connection and Isolation Panel [NHANES]    Frequency of Communication with Friends and Family: More than three times a week    Frequency of Social Gatherings with Friends and Family: More than three times a week    Attends Religious Services: Patient declined    Active Member of Clubs or  Organizations: No    Attends Banker Meetings: Not on file    Marital Status: Living with partner   Vitals:   02/21/24 0854  BP: 110/64  Pulse: 70  Resp: 12  Temp: 98.4 F (36.9 C)  SpO2: 98%   Body mass index is 17.23 kg/m.  Wt Readings from Last 3 Encounters:  02/21/24 88 lb 3.2 oz (40 kg)  01/15/23 85 lb 11.2 oz (38.9 kg)  05/10/22 114 lb (51.7 kg)   Physical Exam Vitals and nursing note reviewed.  Constitutional:      General: She is not in acute distress.    Appearance: She is well-developed and underweight.  HENT:     Head: Normocephalic and atraumatic.     Right Ear: Hearing, tympanic membrane, ear canal and external ear normal.     Left Ear: Hearing, tympanic membrane, ear canal and external ear normal.     Mouth/Throat:     Mouth: Mucous membranes are moist.     Pharynx: Oropharynx is clear. Uvula midline.  Eyes:     Extraocular Movements: Extraocular movements intact.     Conjunctiva/sclera: Conjunctivae normal.     Pupils: Pupils are equal, round, and reactive to light.  Neck:     Thyroid: No thyroid mass or thyromegaly.     Trachea: No tracheal deviation.  Cardiovascular:     Rate and  Rhythm: Normal rate and regular rhythm.     Pulses:          Dorsalis pedis pulses are 2+ on the right side and 2+ on the left side.     Heart sounds: No murmur heard. Pulmonary:     Effort: Pulmonary effort is normal. No respiratory distress.     Breath sounds: Normal breath sounds.  Abdominal:     Palpations: Abdomen is soft. There is no hepatomegaly or mass.     Tenderness: There is no abdominal tenderness.  Genitourinary:    Comments: Deferred to gyn. Musculoskeletal:     Right lower leg: No edema.     Left lower leg: No edema.     Comments: No major deformity or signs of synovitis appreciated.  Lymphadenopathy:     Cervical: No cervical adenopathy.     Upper Body:     Right upper body: No supraclavicular adenopathy.     Left upper body: No supraclavicular adenopathy.  Skin:    General: Skin is warm.     Findings: No erythema or rash.  Neurological:     General: No focal deficit present.     Mental Status: She is alert and oriented to person, place, and time.     Cranial Nerves: No cranial nerve deficit.     Sensory: No sensory deficit.     Motor: No weakness.     Coordination: Coordination normal.     Gait: Gait normal.     Deep Tendon Reflexes:     Reflex Scores:      Bicep reflexes are 2+ on the right side and 2+ on the left side.      Patellar reflexes are 2+ on the right side and 2+ on the left side. Psychiatric:        Mood and Affect: Mood and affect normal.    ASSESSMENT AND PLAN:  Mariah Haas was here today for her annual physical examination.  Orders Placed This Encounter  Procedures   QuantiFERON-TB Gold Plus   Basic metabolic panel with GFR   CBC  TSH   Iron   Ferritin   Varicella Zoster Antibody, IgG   Lab Results  Component Value Date   TSH 0.99 02/21/2024   Lab Results  Component Value Date   WBC 4.4 02/21/2024   HGB 13.4 02/21/2024   HCT 40.0 02/21/2024   MCV 90.1 02/21/2024   PLT 288.0 02/21/2024   Lab Results   Component Value Date   NA 136 02/21/2024   CL 103 02/21/2024   K 4.1 02/21/2024   CO2 25 02/21/2024   BUN 10 02/21/2024   CREATININE 0.72 02/21/2024   GFR 116.07 02/21/2024   CALCIUM 9.1 02/21/2024   ALBUMIN 3.4 (L) 01/01/2022   GLUCOSE 74 02/21/2024   Routine general medical examination at a health care facility We discussed the importance of regular physical activity and healthy diet for prevention of chronic illness and/or complications. Preventive guidelines reviewed. Vaccination up-to-date. Continue her female preventive care with her gynecologist. Form completed and signed. Next CPE in a year.  Varicella vaccination status unknown -     Varicella zoster antibody, IgG; Future  Underweight BMI 17. We discussed the importance of appropriate protein intake. Further recommendation will be given according to the lab results.  -     Basic metabolic panel with GFR; Future -     TSH; Future  Iron deficiency anemia, unspecified iron deficiency anemia type During pregnancy. Currently she is not on iron supplementation. Further recommendation will be given according to lab results.  -     CBC; Future -     Iron; Future -     Ferritin; Future  Screening-pulmonary TB -     QuantiFERON-TB Gold Plus; Future  Return in 1 year (on 02/20/2025) for CPE.  I, Rolla Etienne Wierda, acting as a scribe for Winola Drum [place], MD., have documented all relevant documentation on the behalf of Tahsin Benyo [place], MD, as directed by  Jalisha Enneking [place], MD while in the presence of Messiah Ahr [place], MD.   I, Isair Inabinet [place], MD, have reviewed all documentation for this visit. The documentation on 02/21/24 for the exam, diagnosis, procedures, and orders are all accurate and complete.  Jovi Alvizo G. [place], MD  [facility]. [place] office.

## 2024-02-21 NOTE — Patient Instructions (Addendum)
 A few things to remember from today's visit:  Routine general medical examination at a health care facility  Screening for lipoid disorders  Screening for endocrine, metabolic and immunity disorder  Varicella vaccination status unknown - Plan: Varicella Zoster Antibody, IgG  Underweight - Plan: Basic metabolic panel with GFR, TSH  Iron deficiency anemia, unspecified iron deficiency anemia type - Plan: CBC, Iron, Ferritin  Screening-pulmonary TB - Plan: QuantiFERON-TB Gold Plus  Do not use My Chart to request refills or for acute issues that need immediate attention. If you send a my chart message, it may take a few days to be addressed, specially if I am not in the office.  Please be sure medication list is accurate. If a new problem present, please set up appointment sooner than planned today.  Health Maintenance, Female Adopting a healthy lifestyle and getting preventive care are important in promoting health and wellness. Ask your health care provider about: The right schedule for you to have regular tests and exams. Things you can do on your own to prevent diseases and keep yourself healthy. What should I know about diet, weight, and exercise? Eat a healthy diet  Eat a diet that includes plenty of vegetables, fruits, low-fat dairy products, and lean protein. Do not eat a lot of foods that are high in solid fats, added sugars, or sodium. Maintain a healthy weight Body mass index (BMI) is used to identify weight problems. It estimates body fat based on height and weight. Your health care provider can help determine your BMI and help you achieve or maintain a healthy weight. Get regular exercise Get regular exercise. This is one of the most important things you can do for your health. Most adults should: Exercise for at least 150 minutes each week. The exercise should increase your heart rate and make you sweat (moderate-intensity exercise). Do strengthening exercises at least  twice a week. This is in addition to the moderate-intensity exercise. Spend less time sitting. Even light physical activity can be beneficial. Watch cholesterol and blood lipids Have your blood tested for lipids and cholesterol at 26 years of age, then have this test every 5 years. Have your cholesterol levels checked more often if: Your lipid or cholesterol levels are high. You are older than 26 years of age. You are at high risk for heart disease. What should I know about cancer screening? Depending on your health history and family history, you may need to have cancer screening at various ages. This may include screening for: Breast cancer. Cervical cancer. Colorectal cancer. Skin cancer. Lung cancer. What should I know about heart disease, diabetes, and high blood pressure? Blood pressure and heart disease High blood pressure causes heart disease and increases the risk of stroke. This is more likely to develop in people who have high blood pressure readings or are overweight. Have your blood pressure checked: Every 3-5 years if you are 60-44 years of age. Every year if you are 52 years old or older. Diabetes Have regular diabetes screenings. This checks your fasting blood sugar level. Have the screening done: Once every three years after age 29 if you are at a normal weight and have a low risk for diabetes. More often and at a younger age if you are overweight or have a high risk for diabetes. What should I know about preventing infection? Hepatitis B If you have a higher risk for hepatitis B, you should be screened for this virus. Talk with your health care provider to find  out if you are at risk for hepatitis B infection. Hepatitis C Testing is recommended for: Everyone born from [birth year] through 1963. Anyone with known risk factors for hepatitis C. Sexually transmitted infections (STIs) Get screened for STIs, including gonorrhea and chlamydia, if: You are sexually active and are  younger than 26 years of age. You are older than 26 years of age and your health care provider tells you that you are at risk for this type of infection. Your sexual activity has changed since you were last screened, and you are at increased risk for chlamydia or gonorrhea. Ask your health care provider if you are at risk. Ask your health care provider about whether you are at high risk for HIV. Your health care provider may recommend a prescription medicine to help prevent HIV infection. If you choose to take medicine to prevent HIV, you should first get tested for HIV. You should then be tested every 3 months for as long as you are taking the medicine. Pregnancy If you are about to stop having your period (premenopausal) and you may become pregnant, seek counseling before you get pregnant. Take 400 to 800 micrograms (mcg) of folic acid every day if you become pregnant. Ask for birth control (contraception) if you want to prevent pregnancy. Osteoporosis and menopause Osteoporosis is a disease in which the bones lose minerals and strength with aging. This can result in bone fractures. If you are 30 years old or older, or if you are at risk for osteoporosis and fractures, ask your health care provider if you should: Be screened for bone loss. Take a calcium or vitamin D supplement to lower your risk of fractures. Be given hormone replacement therapy (HRT) to treat symptoms of menopause. Follow these instructions at home: Alcohol use Do not drink alcohol if: Your health care provider tells you not to drink. You are pregnant, may be pregnant, or are planning to become pregnant. If you drink alcohol: Limit how much you have to: 0-1 drink a day. Know how much alcohol is in your drink. In the [place] one drink equals one 12 oz bottle of beer (355 mL), one 5 oz glass of wine (148 mL), or one 1 oz glass of hard liquor (44 mL). Lifestyle Do not use any products that contain nicotine or tobacco. These  products include cigarettes, chewing tobacco, and vaping devices, such as e-cigarettes. If you need help quitting, ask your health care provider. Do not use street drugs. Do not share needles. Ask your health care provider for help if you need support or information about quitting drugs. General instructions Schedule regular health, dental, and eye exams. Stay current with your vaccines. Tell your health care provider if: You often feel depressed. You have ever been abused or do not feel safe at home. Summary Adopting a healthy lifestyle and getting preventive care are important in promoting health and wellness. Follow your health care provider's instructions about healthy diet, exercising, and getting tested or screened for diseases. Follow your health care provider's instructions on monitoring your cholesterol and blood pressure. This information is not intended to replace advice given to you by your health care provider. Make sure you discuss any questions you have with your health care provider. Document Revised: 03/27/2021 Document Reviewed: 03/27/2021 Elsevier Patient Education  2024 [employer].

## 2024-02-25 LAB — QUANTIFERON-TB GOLD PLUS
Mitogen-NIL: 2.69 [IU]/mL
NIL: 0.01 [IU]/mL
QuantiFERON-TB Gold Plus: NEGATIVE
TB1-NIL: 0 [IU]/mL
TB2-NIL: 0 [IU]/mL

## 2024-02-25 LAB — VARICELLA ZOSTER ANTIBODY, IGG: Varicella IgG: 1.34 {s_co_ratio}

## 2024-02-27 ENCOUNTER — Telehealth: Payer: Self-pay | Admitting: *Deleted

## 2024-02-27 NOTE — Telephone Encounter (Signed)
 Copied from CRM [id number]. Topic: General - Other >> Feb 27, 2024  3:34 PM Almira Coaster wrote: Reason for CRM: Patient is calling for a hard copy of their vaccination records. She will like to know if she has to pick it up in person or if this is something that can be mailed to her. Best call back number is [phone].

## 2024-05-23 ENCOUNTER — Encounter (HOSPITAL_COMMUNITY): Payer: Self-pay | Admitting: Emergency Medicine

## 2024-05-23 ENCOUNTER — Other Ambulatory Visit: Payer: Self-pay

## 2024-05-23 ENCOUNTER — Emergency Department (HOSPITAL_COMMUNITY): Payer: Self-pay

## 2024-05-23 ENCOUNTER — Emergency Department (HOSPITAL_COMMUNITY)
Admission: EM | Admit: 2024-05-23 | Discharge: 2024-05-23 | Disposition: A | Discharge status: Home / Self Care | Payer: Self-pay

## 2024-05-23 DIAGNOSIS — S99921A Unspecified injury of right foot, initial encounter: Secondary | ICD-10-CM | POA: Diagnosis present

## 2024-05-23 DIAGNOSIS — W1781XA Fall down embankment (hill), initial encounter: Secondary | ICD-10-CM | POA: Insufficient documentation

## 2024-05-23 DIAGNOSIS — Y92830 Public park as the place of occurrence of the external cause: Secondary | ICD-10-CM | POA: Insufficient documentation

## 2024-05-23 DIAGNOSIS — J45909 Unspecified asthma, uncomplicated: Secondary | ICD-10-CM | POA: Diagnosis not present

## 2024-05-23 DIAGNOSIS — S92309A Fracture of unspecified metatarsal bone(s), unspecified foot, initial encounter for closed fracture: Secondary | ICD-10-CM

## 2024-05-23 DIAGNOSIS — S92311A Displaced fracture of first metatarsal bone, right foot, initial encounter for closed fracture: Secondary | ICD-10-CM | POA: Diagnosis not present

## 2024-05-23 DIAGNOSIS — S92312A Displaced fracture of first metatarsal bone, left foot, initial encounter for closed fracture: Secondary | ICD-10-CM | POA: Diagnosis not present

## 2024-05-23 MED ORDER — OXYCODONE HCL 5 MG PO TABS: 5.0000 mg | ORAL_TABLET | ORAL | 0 refills | Status: AC | PRN

## 2024-05-23 MED ORDER — ACETAMINOPHEN 325 MG PO TABS
650.0000 mg | ORAL_TABLET | Freq: Once | ORAL | Status: AC
  Administered 2024-05-23: 650 mg via ORAL
  Filled 2024-05-23: qty 2

## 2024-05-23 MED ORDER — OXYCODONE HCL 5 MG PO TABS
5.0000 mg | ORAL_TABLET | Freq: Once | ORAL | Status: AC
  Administered 2024-05-23: 5 mg via ORAL
  Filled 2024-05-23: qty 1
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
## 2024-05-23 NOTE — Discharge Instructions (Addendum)
 You will need to follow up with Dr. Isiah ortho office early next week. Seek emergency care if experiencing any new or worsening symptoms.   Alternating between 650 mg Tylenol  and 400 mg Advil : The best way to alternate taking Acetaminophen  (example Tylenol ) and Ibuprofen  (example Advil /Motrin ) is to take them 3 hours apart. For example, if you take ibuprofen  at 6 am you can then take Tylenol  at 9 am. You can continue this regimen throughout the day, making sure you do not exceed the recommended maximum dose for each drug.

## 2024-09-25 ENCOUNTER — Encounter (HOSPITAL_BASED_OUTPATIENT_CLINIC_OR_DEPARTMENT_OTHER): Payer: Self-pay

## 2024-09-25 ENCOUNTER — Other Ambulatory Visit: Payer: Self-pay

## 2024-09-25 ENCOUNTER — Emergency Department (HOSPITAL_BASED_OUTPATIENT_CLINIC_OR_DEPARTMENT_OTHER)
Admission: EM | Admit: 2024-09-25 | Discharge: 2024-09-25 | Disposition: A | Discharge status: Home / Self Care | Attending: Emergency Medicine | Admitting: Emergency Medicine

## 2024-09-25 DIAGNOSIS — J45909 Unspecified asthma, uncomplicated: Secondary | ICD-10-CM | POA: Insufficient documentation

## 2024-09-25 DIAGNOSIS — Y92410 Unspecified street and highway as the place of occurrence of the external cause: Secondary | ICD-10-CM | POA: Diagnosis not present

## 2024-09-25 DIAGNOSIS — S8992XA Unspecified injury of left lower leg, initial encounter: Secondary | ICD-10-CM | POA: Diagnosis present

## 2024-09-25 DIAGNOSIS — R1030 Lower abdominal pain, unspecified: Secondary | ICD-10-CM | POA: Diagnosis not present

## 2024-09-25 DIAGNOSIS — M549 Dorsalgia, unspecified: Secondary | ICD-10-CM | POA: Diagnosis not present

## 2024-09-25 DIAGNOSIS — S80812A Abrasion, left lower leg, initial encounter: Secondary | ICD-10-CM | POA: Diagnosis not present

## 2024-09-25 DIAGNOSIS — M542 Cervicalgia: Secondary | ICD-10-CM | POA: Diagnosis not present

## 2024-09-25 DIAGNOSIS — R519 Headache, unspecified: Secondary | ICD-10-CM | POA: Insufficient documentation

## 2024-09-25 LAB — URINALYSIS, MICROSCOPIC (REFLEX)

## 2024-09-25 LAB — URINALYSIS, ROUTINE W REFLEX MICROSCOPIC
Bilirubin Urine: NEGATIVE
Glucose, UA: NEGATIVE mg/dL
Ketones, ur: NEGATIVE mg/dL
Leukocytes,Ua: NEGATIVE
Nitrite: NEGATIVE
Protein, ur: NEGATIVE mg/dL
Specific Gravity, Urine: 1.01 (ref 1.005–1.030)
pH: 6.5 (ref 5.0–8.0)

## 2024-09-25 LAB — PREGNANCY, URINE: Preg Test, Ur: NEGATIVE

## 2024-09-25 MED ORDER — IBUPROFEN 600 MG PO TABS: 600.0000 mg | ORAL_TABLET | Freq: Four times a day (QID) | ORAL | 0 refills | Status: AC | PRN

## 2024-09-25 MED ORDER — ACETAMINOPHEN 500 MG PO TABS
1000.0000 mg | ORAL_TABLET | Freq: Once | ORAL | Status: AC
  Administered 2024-09-25: 1000 mg via ORAL
  Filled 2024-09-25: qty 2

## 2024-09-25 MED ORDER — CYCLOBENZAPRINE HCL 5 MG PO TABS: 5.0000 mg | ORAL_TABLET | Freq: Two times a day (BID) | ORAL | 0 refills | Status: AC | PRN
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
